# Patient Record
Sex: Female | Born: 1945 | Race: White | Hispanic: No | Marital: Married | Smoking: Former smoker
Health system: Southern US, Community
[De-identification: ages and names within clinical notes are randomized; demographics above are authoritative.]

## PROBLEM LIST (undated history)

## (undated) DIAGNOSIS — K648 Other hemorrhoids: Secondary | ICD-10-CM

## (undated) DIAGNOSIS — R159 Full incontinence of feces: Secondary | ICD-10-CM

## (undated) HISTORY — DX: Full incontinence of feces: R15.9

## (undated) HISTORY — PX: APPENDECTOMY: SHX54

## (undated) HISTORY — DX: Other hemorrhoids: K64.8

## (undated) HISTORY — PX: TONSILLECTOMY: SUR1361

## (undated) HISTORY — PX: HEMORRHOID BANDING: SHX5850

---

## 1999-05-13 ENCOUNTER — Encounter: Admission: RE | Admit: 1999-05-13 | Discharge: 1999-05-13 | Payer: Self-pay | Admitting: Gynecology

## 1999-05-13 ENCOUNTER — Encounter: Payer: Self-pay | Admitting: Gynecology

## 2000-12-05 ENCOUNTER — Other Ambulatory Visit: Admission: RE | Admit: 2000-12-05 | Discharge: 2000-12-05 | Payer: Self-pay | Admitting: Gynecology

## 2001-10-01 ENCOUNTER — Encounter: Admission: RE | Admit: 2001-10-01 | Discharge: 2001-10-01 | Payer: Self-pay | Admitting: Gynecology

## 2001-10-01 ENCOUNTER — Encounter: Payer: Self-pay | Admitting: Gynecology

## 2002-01-21 ENCOUNTER — Other Ambulatory Visit: Admission: RE | Admit: 2002-01-21 | Discharge: 2002-01-21 | Payer: Self-pay | Admitting: Gynecology

## 2003-04-21 ENCOUNTER — Other Ambulatory Visit: Admission: RE | Admit: 2003-04-21 | Discharge: 2003-04-21 | Payer: Self-pay | Admitting: Gynecology

## 2003-10-13 ENCOUNTER — Encounter: Admission: RE | Admit: 2003-10-13 | Discharge: 2003-10-13 | Payer: Self-pay | Admitting: Gynecology

## 2004-11-02 ENCOUNTER — Other Ambulatory Visit: Admission: RE | Admit: 2004-11-02 | Discharge: 2004-11-02 | Payer: Self-pay | Admitting: Gynecology

## 2005-10-20 ENCOUNTER — Encounter: Admission: RE | Admit: 2005-10-20 | Discharge: 2005-10-20 | Payer: Self-pay | Admitting: Gynecology

## 2006-09-19 ENCOUNTER — Other Ambulatory Visit: Admission: RE | Admit: 2006-09-19 | Discharge: 2006-09-19 | Payer: Self-pay | Admitting: Gynecology

## 2006-11-28 ENCOUNTER — Encounter: Admission: RE | Admit: 2006-11-28 | Discharge: 2006-11-28 | Payer: Self-pay | Admitting: Gynecology

## 2008-02-11 ENCOUNTER — Encounter: Admission: RE | Admit: 2008-02-11 | Discharge: 2008-02-11 | Payer: Self-pay | Admitting: Gynecology

## 2009-03-19 ENCOUNTER — Encounter: Admission: RE | Admit: 2009-03-19 | Discharge: 2009-03-19 | Payer: Self-pay | Admitting: Gynecology

## 2010-03-24 ENCOUNTER — Encounter: Admission: RE | Admit: 2010-03-24 | Discharge: 2010-03-24 | Payer: Self-pay | Admitting: Gynecology

## 2011-09-05 ENCOUNTER — Other Ambulatory Visit: Payer: Self-pay | Admitting: Gynecology

## 2011-09-05 DIAGNOSIS — Z1231 Encounter for screening mammogram for malignant neoplasm of breast: Secondary | ICD-10-CM

## 2011-09-22 ENCOUNTER — Ambulatory Visit
Admission: RE | Admit: 2011-09-22 | Discharge: 2011-09-22 | Disposition: A | Discharge status: Home / Self Care | Payer: BC Managed Care – PPO | Source: Ambulatory Visit | Attending: Gynecology | Admitting: Gynecology

## 2011-09-22 DIAGNOSIS — Z1231 Encounter for screening mammogram for malignant neoplasm of breast: Secondary | ICD-10-CM

## 2012-05-02 DIAGNOSIS — N8184 Pelvic muscle wasting: Secondary | ICD-10-CM | POA: Diagnosis not present

## 2012-05-02 DIAGNOSIS — Z01419 Encounter for gynecological examination (general) (routine) without abnormal findings: Secondary | ICD-10-CM | POA: Diagnosis not present

## 2012-05-02 DIAGNOSIS — Z23 Encounter for immunization: Secondary | ICD-10-CM | POA: Diagnosis not present

## 2012-05-02 DIAGNOSIS — E7800 Pure hypercholesterolemia, unspecified: Secondary | ICD-10-CM | POA: Diagnosis not present

## 2012-05-02 DIAGNOSIS — R7989 Other specified abnormal findings of blood chemistry: Secondary | ICD-10-CM | POA: Diagnosis not present

## 2012-06-18 DIAGNOSIS — D485 Neoplasm of uncertain behavior of skin: Secondary | ICD-10-CM | POA: Diagnosis not present

## 2012-11-19 ENCOUNTER — Other Ambulatory Visit: Payer: Self-pay

## 2012-11-19 DIAGNOSIS — Z1231 Encounter for screening mammogram for malignant neoplasm of breast: Secondary | ICD-10-CM

## 2012-12-30 ENCOUNTER — Ambulatory Visit
Admission: RE | Admit: 2012-12-30 | Discharge: 2012-12-30 | Disposition: A | Discharge status: Home / Self Care | Payer: Medicare Other | Source: Ambulatory Visit

## 2012-12-30 DIAGNOSIS — Z1231 Encounter for screening mammogram for malignant neoplasm of breast: Secondary | ICD-10-CM | POA: Diagnosis not present

## 2013-05-06 DIAGNOSIS — N952 Postmenopausal atrophic vaginitis: Secondary | ICD-10-CM | POA: Diagnosis not present

## 2013-05-06 DIAGNOSIS — Z23 Encounter for immunization: Secondary | ICD-10-CM | POA: Diagnosis not present

## 2013-05-06 DIAGNOSIS — Z7189 Other specified counseling: Secondary | ICD-10-CM | POA: Diagnosis not present

## 2013-05-06 DIAGNOSIS — F329 Major depressive disorder, single episode, unspecified: Secondary | ICD-10-CM | POA: Diagnosis not present

## 2013-09-16 DIAGNOSIS — L819 Disorder of pigmentation, unspecified: Secondary | ICD-10-CM | POA: Diagnosis not present

## 2013-09-16 DIAGNOSIS — D235 Other benign neoplasm of skin of trunk: Secondary | ICD-10-CM | POA: Diagnosis not present

## 2013-09-16 DIAGNOSIS — D485 Neoplasm of uncertain behavior of skin: Secondary | ICD-10-CM | POA: Diagnosis not present

## 2014-01-20 DIAGNOSIS — Z1231 Encounter for screening mammogram for malignant neoplasm of breast: Secondary | ICD-10-CM | POA: Diagnosis not present

## 2014-01-20 DIAGNOSIS — Z01419 Encounter for gynecological examination (general) (routine) without abnormal findings: Secondary | ICD-10-CM | POA: Diagnosis not present

## 2014-01-20 DIAGNOSIS — Z79890 Hormone replacement therapy (postmenopausal): Secondary | ICD-10-CM | POA: Diagnosis not present

## 2014-01-20 DIAGNOSIS — Z780 Asymptomatic menopausal state: Secondary | ICD-10-CM | POA: Diagnosis not present

## 2014-04-05 DIAGNOSIS — Z23 Encounter for immunization: Secondary | ICD-10-CM | POA: Diagnosis not present

## 2014-04-23 DIAGNOSIS — M7541 Impingement syndrome of right shoulder: Secondary | ICD-10-CM | POA: Diagnosis not present

## 2014-04-29 DIAGNOSIS — B009 Herpesviral infection, unspecified: Secondary | ICD-10-CM | POA: Diagnosis not present

## 2014-04-29 DIAGNOSIS — D485 Neoplasm of uncertain behavior of skin: Secondary | ICD-10-CM | POA: Diagnosis not present

## 2014-06-02 DIAGNOSIS — N952 Postmenopausal atrophic vaginitis: Secondary | ICD-10-CM | POA: Diagnosis not present

## 2014-06-02 DIAGNOSIS — Z136 Encounter for screening for cardiovascular disorders: Secondary | ICD-10-CM | POA: Diagnosis not present

## 2014-06-02 DIAGNOSIS — Z23 Encounter for immunization: Secondary | ICD-10-CM | POA: Diagnosis not present

## 2014-06-02 DIAGNOSIS — J309 Allergic rhinitis, unspecified: Secondary | ICD-10-CM | POA: Diagnosis not present

## 2014-06-02 DIAGNOSIS — F329 Major depressive disorder, single episode, unspecified: Secondary | ICD-10-CM | POA: Diagnosis not present

## 2014-06-02 DIAGNOSIS — Z131 Encounter for screening for diabetes mellitus: Secondary | ICD-10-CM | POA: Diagnosis not present

## 2014-07-29 DIAGNOSIS — L820 Inflamed seborrheic keratosis: Secondary | ICD-10-CM | POA: Diagnosis not present

## 2014-08-12 ENCOUNTER — Encounter: Payer: Self-pay | Admitting: Gastroenterology

## 2014-08-12 ENCOUNTER — Ambulatory Visit (INDEPENDENT_AMBULATORY_CARE_PROVIDER_SITE_OTHER): Payer: Medicare Other | Admitting: Gastroenterology

## 2014-08-12 VITALS — BP 94/58 | HR 72 | Ht 63.0 in | Wt 133.6 lb

## 2014-08-12 DIAGNOSIS — R159 Full incontinence of feces: Secondary | ICD-10-CM | POA: Diagnosis not present

## 2014-08-12 MED ORDER — NA SULFATE-K SULFATE-MG SULF 17.5-3.13-1.6 GM/177ML PO SOLN
1.0000 | Freq: Once | ORAL | Status: DC
Start: 2014-08-12 — End: 2014-10-27

## 2014-08-12 NOTE — Assessment & Plan Note (Signed)
She is experiencing some leakage of loose stools with straining during physical activity.  I suspect this is related to hemorrhoids.  There is no evidence for local rectal disease as determined by digital exam.  Recommendations #1 daily fiber supplementation #2 colonoscopy #3 to consider band ligation if it is determined that symptoms are due to hemorrhoids

## 2014-08-12 NOTE — Patient Instructions (Signed)

## 2014-08-12 NOTE — Progress Notes (Signed)
    _                                                                                                                History of Present Illness:  Alexandra Wilkerson is a pleasant 69 year old white female referred at the request of Dr. 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  For the last 8 months she has noted staining of her underclothes with stool when she engages in physical activity including weight lifting.  She is aware of the passage of a small amount of stool.  Stools generally are loose.  There has been no change in bowel habits, abdominal or rectal pain or bleeding.  Last colonoscopy was over 10 years ago.   History reviewed. No pertinent past medical history. History reviewed. No pertinent past surgical history. family history is not on file. Current Outpatient Prescriptions  Medication Sig Dispense Refill  . conjugated estrogens (PREMARIN) vaginal cream Place 1 Applicatorful vaginally daily.    Marland Kitchen escitalopram (LEXAPRO) 10 MG tablet Take 10 mg by mouth daily.  3  . naproxen sodium (ANAPROX) 220 MG tablet Take 220 mg by mouth 2 (two) times daily with a meal.    . valACYclovir (VALTREX) 500 MG tablet Take 500 mg by mouth as needed.     No current facility-administered medications for this visit.   Allergies as of 08/12/2014  . (No Known Allergies)    reports that she has quit smoking. She has never used smokeless tobacco. She reports that she drinks alcohol. She reports that she does not use illicit drugs.   Review of Systems: Pertinent positive and negative review of systems were noted in the above HPI section. All other review of systems were otherwise negative.  Vital signs were reviewed in today's medical record Physical Exam: General: Well developed , well nourished, no acute distress Skin: anicteric Head: Normocephalic and atraumatic Eyes:  sclerae anicteric, EOMI Ears: Normal auditory acuity Mouth: No deformity or lesions Neck: Supple, no masses or  thyromegaly Lungs: Clear throughout to auscultation Heart: Regular rate and rhythm; no murmurs, rubs or bruits Abdomen: Soft, non tender and non distended. No masses, hepatosplenomegaly or hernias noted. Normal Bowel sounds Rectal: There are no rectal masses.  Tone is normal.  Heme negative Musculoskeletal: Symmetrical with no gross deformities  Skin: No lesions on visible extremities Pulses:  Normal pulses noted Extremities: No clubbing, cyanosis, edema or deformities noted Neurological: Alert oriented x 4, grossly nonfocal Cervical Nodes:  No significant cervical adenopathy Inguinal Nodes: No significant inguinal adenopathy Psychological:  Alert and cooperative. Normal mood and affect  See Assessment and Plan under Problem List

## 2014-09-17 ENCOUNTER — Encounter: Payer: Medicare Other | Admitting: Gastroenterology

## 2014-10-27 ENCOUNTER — Ambulatory Visit (AMBULATORY_SURGERY_CENTER): Payer: Medicare Other | Admitting: Gastroenterology

## 2014-10-27 ENCOUNTER — Encounter: Payer: Self-pay | Admitting: Gastroenterology

## 2014-10-27 VITALS — BP 145/77 | HR 54 | Temp 97.8°F | Resp 15 | Ht 63.0 in | Wt 133.0 lb

## 2014-10-27 DIAGNOSIS — F341 Dysthymic disorder: Secondary | ICD-10-CM | POA: Diagnosis not present

## 2014-10-27 DIAGNOSIS — R159 Full incontinence of feces: Secondary | ICD-10-CM | POA: Diagnosis not present

## 2014-10-27 DIAGNOSIS — K5730 Diverticulosis of large intestine without perforation or abscess without bleeding: Secondary | ICD-10-CM

## 2014-10-27 MED ORDER — SODIUM CHLORIDE 0.9 % IV SOLN: 500.0000 mL | INTRAVENOUS | Status: DC

## 2014-10-27 NOTE — Progress Notes (Signed)
Report to PACU, RN, vss, BBS= Clear.  

## 2014-10-27 NOTE — Patient Instructions (Addendum)
Hemorrhoids and diverticulosis seen today. Repeat colonoscopy in 10 years.  Handouts given:hemorrhoids, hemorrhoidal banding, and diverticulosis.   YOU HAD AN ENDOSCOPIC PROCEDURE TODAY AT [facility]:   Refer to the procedure report that was given to you for any specific questions about what was found during the examination.  If the procedure report does not answer your questions, please call your gastroenterologist to clarify.  If you requested that your care partner not be given the details of your procedure findings, then the procedure report has been included in a sealed envelope for you to review at your convenience later.  YOU SHOULD EXPECT: Some feelings of bloating in the abdomen. Passage of more gas than usual.  Walking can help get rid of the air that was put into your GI tract during the procedure and reduce the bloating. If you had a lower endoscopy (such as a colonoscopy or flexible sigmoidoscopy) you may notice spotting of blood in your stool or on the toilet paper. If you underwent a bowel prep for your procedure, you may not have a normal bowel movement for a few days.  Please Note:  You might notice some irritation and congestion in your nose or some drainage.  This is from the oxygen used during your procedure.  There is no need for concern and it should clear up in a day or so.  SYMPTOMS TO REPORT IMMEDIATELY:   Following lower endoscopy (colonoscopy or flexible sigmoidoscopy):  Excessive amounts of blood in the stool  Significant tenderness or worsening of abdominal pains  Swelling of the abdomen that is new, acute  Fever of 100F or higher   For urgent or emergent issues, a gastroenterologist can be reached at any hour by calling [phone].   DIET: Your first meal following the procedure should be a small meal and then it is ok to progress to your normal diet. Heavy or fried foods are harder to digest and may make you feel nauseous or bloated.   Likewise, meals heavy in dairy and vegetables can increase bloating.  Drink plenty of fluids but you should avoid alcoholic beverages for 24 hours.  ACTIVITY:  You should plan to take it easy for the rest of today and you should NOT DRIVE or use heavy machinery until tomorrow (because of the sedation medicines used during the test).    FOLLOW UP: Our staff will call the number listed on your records the next business day following your procedure to check on you and address any questions or concerns that you may have regarding the information given to you following your procedure. If we do not reach you, we will leave a message.  However, if you are feeling well and you are not experiencing any problems, there is no need to return our call.  We will assume that you have returned to your regular daily activities without incident.  If any biopsies were taken you will be contacted by phone or by letter within the next 1-3 weeks.  Please call us at [phone] if you have not heard about the biopsies in 3 weeks.    SIGNATURES/CONFIDENTIALITY: You and/or your care partner have signed paperwork which will be entered into your electronic medical record.  These signatures attest to the fact that that the information above on your After Visit Summary has been reviewed and is understood.  Full responsibility of the confidentiality of this discharge information lies with you and/or your care-partner.

## 2014-10-27 NOTE — Op Note (Signed)
[place]  [address] [place], [id number]   COLONOSCOPY PROCEDURE REPORT  PATIENT: Alexandra Wilkerson, Alexandra Wilkerson  MR#: [id number] BIRTHDATE: 01-10-1946 , 37  yrs. old GENDER: female ENDOSCOPIST: Inda Castle, MD REFERRED [id number] Sabra Heck, M.D. PROCEDURE DATE:  10/27/2014 PROCEDURE:   Colonoscopy, diagnostic First Screening Colonoscopy - Avg.  risk and is 50 yrs.  old or older - No.  Prior Negative Screening - Now for repeat screening. 10 or more years since last screening  History of Adenoma - Now for follow-up colonoscopy & has been > or = to 3 yrs.  N/A  Polyps removed today? No Recommend repeat exam, <10 yrs? No ASA CLASS:   Class II INDICATIONS:Colorectal Neoplasm Risk Assessment for this procedure is average risk.   encopresis MEDICATIONS: Monitored anesthesia care and Propofol 240 mg IV  DESCRIPTION OF PROCEDURE:   After the risks benefits and alternatives of the procedure were thoroughly explained, informed consent was obtained.  The digital rectal exam revealed no abnormalities of the rectum.   The LB [id number]  endoscope was introduced through the anus and advanced to the cecum, which was identified by both the appendix and ileocecal valve. No adverse events experienced.   The quality of the prep was (Suprep was used) excellent.  The instrument was then slowly withdrawn as the colon was fully examined. Estimated blood loss is zero unless otherwise noted in this procedure report.      COLON FINDINGS: Internal hemorrhoids were found.   There was mild diverticulosis noted in the descending colon and transverse colon. The examination was otherwise normal.  Retroflexed views revealed no abnormalities. The time to cecum = 6.5 Withdrawal time = 8.4 The scope was withdrawn and the procedure completed. COMPLICATIONS: There were no immediate complications.  ENDOSCOPIC IMPRESSION: 1.   Internal hemorrhoids 2.   Mild diverticulosis was noted in the descending  colon and transverse colon 3.   The examination was otherwise normal  RECOMMENDATIONS: Repeat Colonscopy in 10 years. band ligation of internal hemorrhoids  eSigned:  Inda Castle, MD 10/27/2014 3:20 PM   cc:   PATIENT NAME:  Alexandra Wilkerson, Alexandra Wilkerson MR#: [id number]

## 2014-10-28 ENCOUNTER — Telehealth: Payer: Self-pay | Admitting: *Deleted

## 2014-10-28 NOTE — Telephone Encounter (Signed)
  Follow up Call-  Call back number 10/27/2014  Post procedure Call Back phone  # [phone]  Permission to leave phone message Yes     Patient questions:  Do you have a fever, pain , or abdominal swelling? No. Pain Score  0 *  Have you tolerated food without any problems? Yes.    Have you been able to return to your normal activities? Yes.    Do you have any questions about your discharge instructions: Diet   No. Medications  No. Follow up visit  No.  Do you have questions or concerns about your Care? No.  Actions: * If pain score is 4 or above: No action needed, pain <4.

## 2014-12-07 ENCOUNTER — Encounter: Payer: Medicare Other | Admitting: Gastroenterology

## 2014-12-29 DIAGNOSIS — I87393 Chronic venous hypertension (idiopathic) with other complications of bilateral lower extremity: Secondary | ICD-10-CM | POA: Diagnosis not present

## 2014-12-29 DIAGNOSIS — M79604 Pain in right leg: Secondary | ICD-10-CM | POA: Diagnosis not present

## 2014-12-29 DIAGNOSIS — M79605 Pain in left leg: Secondary | ICD-10-CM | POA: Diagnosis not present

## 2014-12-31 DIAGNOSIS — M79604 Pain in right leg: Secondary | ICD-10-CM | POA: Diagnosis not present

## 2014-12-31 DIAGNOSIS — M79605 Pain in left leg: Secondary | ICD-10-CM | POA: Diagnosis not present

## 2015-01-05 DIAGNOSIS — I83813 Varicose veins of bilateral lower extremities with pain: Secondary | ICD-10-CM | POA: Diagnosis not present

## 2015-02-01 DIAGNOSIS — F329 Major depressive disorder, single episode, unspecified: Secondary | ICD-10-CM | POA: Diagnosis not present

## 2015-02-01 DIAGNOSIS — Z0000 Encounter for general adult medical examination without abnormal findings: Secondary | ICD-10-CM | POA: Diagnosis not present

## 2015-02-01 DIAGNOSIS — J309 Allergic rhinitis, unspecified: Secondary | ICD-10-CM | POA: Diagnosis not present

## 2015-02-01 DIAGNOSIS — N952 Postmenopausal atrophic vaginitis: Secondary | ICD-10-CM | POA: Diagnosis not present

## 2015-02-01 DIAGNOSIS — Z780 Asymptomatic menopausal state: Secondary | ICD-10-CM | POA: Diagnosis not present

## 2015-02-01 DIAGNOSIS — Z23 Encounter for immunization: Secondary | ICD-10-CM | POA: Diagnosis not present

## 2015-02-02 ENCOUNTER — Encounter: Payer: Self-pay | Admitting: Gastroenterology

## 2015-02-02 ENCOUNTER — Ambulatory Visit (INDEPENDENT_AMBULATORY_CARE_PROVIDER_SITE_OTHER): Payer: Medicare Other | Admitting: Gastroenterology

## 2015-02-02 VITALS — BP 118/80 | HR 76 | Ht 63.0 in | Wt 135.0 lb

## 2015-02-02 DIAGNOSIS — K648 Other hemorrhoids: Secondary | ICD-10-CM | POA: Diagnosis not present

## 2015-02-02 NOTE — Progress Notes (Signed)
PROCEDURE NOTE: The patient presents with symptomatic grade *2**  hemorrhoids, requesting rubber band ligation of his/her hemorrhoidal disease.  All risks, benefits and alternative forms of therapy were described and informed consent was obtained.   The anorectum was pre-medicated with lubricant and nitroglycerine ointment The decision was made to band the *right posterior** internal hemorrhoid, and the [facility] System was used to perform band ligation without complication.  Digital anorectal examination was then performed to assure proper positioning of the band, and to adjust the banded tissue as required.  The patient was discharged home without pain or other issues.  Dietary and behavioral recommendations were given and along with follow-up instructions.    The patient will return in **4* weeks for  follow-up and possible additional banding as required. No complications were encountered and the patient tolerated the procedure well.   

## 2015-02-02 NOTE — Patient Instructions (Signed)
HEMORRHOID BANDING PROCEDURE    FOLLOW-UP CARE   1. The procedure you have had should have been relatively painless since the banding of the area involved does not have nerve endings and there is no pain sensation.  The rubber band cuts off the blood supply to the hemorrhoid and the band may fall off as soon as 48 hours after the banding (the band may occasionally be seen in the toilet bowl following a bowel movement). You may notice a temporary feeling of fullness in the rectum which should respond adequately to plain Tylenol or Motrin.  2. Following the banding, avoid strenuous exercise that evening and resume full activity the next day.  A sitz bath (soaking in a warm tub) or bidet is soothing, and can be useful for cleansing the area after bowel movements.     3. To avoid constipation, take two tablespoons of natural wheat bran, natural oat bran, flax, Benefiber or any over the counter fiber supplement and increase your water intake to 7-8 glasses daily.    4. Unless you have been prescribed anorectal medication, do not put anything inside your rectum for two weeks: No suppositories, enemas, fingers, etc.  5. Occasionally, you may have more bleeding than usual after the banding procedure.  This is often from the untreated hemorrhoids rather than the treated one.  Don't be concerned if there is a tablespoon or so of blood.  If there is more blood than this, lie flat with your bottom higher than your head and apply an ice pack to the area. If the bleeding does not stop within a half an hour or if you feel faint, call our office at [phone] or go to the emergency room.  6. Problems are not common; however, if there is a substantial amount of bleeding, severe pain, chills, fever or difficulty passing urine (very rare) or other problems, you should call us at [phone] or report to the nearest emergency room.  7. Do not stay seated continuously for more than 2-3 hours for a day or two  after the procedure.  Tighten your buttock muscles 10-15 times every two hours and take 10-15 deep breaths every 1-2 hours.  Do not spend more than a few minutes on the toilet if you cannot empty your bowel; instead re-visit the toilet at a later time.   Your 2nd banding is scheduled on 04/05/2015 at 10am

## 2015-02-15 DIAGNOSIS — M8589 Other specified disorders of bone density and structure, multiple sites: Secondary | ICD-10-CM | POA: Diagnosis not present

## 2015-02-15 DIAGNOSIS — E2839 Other primary ovarian failure: Secondary | ICD-10-CM | POA: Diagnosis not present

## 2015-02-23 ENCOUNTER — Telehealth: Payer: Self-pay | Admitting: Internal Medicine

## 2015-02-23 NOTE — Telephone Encounter (Signed)
Spoke w/pt and sch'd appt for 04-13-15 w/Dr. Carlean Purl

## 2015-02-23 NOTE — Telephone Encounter (Signed)
yes

## 2015-04-05 ENCOUNTER — Encounter: Payer: Medicare Other | Admitting: Gastroenterology

## 2015-04-13 ENCOUNTER — Encounter: Payer: Self-pay | Admitting: Internal Medicine

## 2015-04-13 ENCOUNTER — Ambulatory Visit (INDEPENDENT_AMBULATORY_CARE_PROVIDER_SITE_OTHER): Payer: Medicare Other | Admitting: Internal Medicine

## 2015-04-13 VITALS — BP 114/76 | HR 72 | Ht 62.25 in | Wt 138.1 lb

## 2015-04-13 DIAGNOSIS — K648 Other hemorrhoids: Secondary | ICD-10-CM | POA: Diagnosis not present

## 2015-04-13 NOTE — Assessment & Plan Note (Signed)
LL banded

## 2015-04-13 NOTE — Progress Notes (Signed)
   Sxs are fecal soiling - improved since banding of RP  Anoscopy was performed with the patient in the left lateral decubitus position while a chaperone was present and revealed gr LL and RA internal hemorrhoids2   PROCEDURE NOTE: The patient presents with symptomatic grade 2  hemorrhoids, requesting rubber band ligation of his/her hemorrhoidal disease.  All risks, benefits and alternative forms of therapy were described and informed consent was obtained.   The anorectum was pre-medicated with NTG/lidocaine The decision was made to band the LL internal hemorrhoid, and the [place] was used to perform band ligation without complication.  Digital anorectal examination was then performed to assure proper positioning of the band, and to adjust the banded tissue as required.  The patient was discharged home without pain or other issues.  Dietary and behavioral recommendations were given and along with follow-up instructions.       The patient will return in Jan 2017 for  follow-up and possible additional banding as required. No complications were encountered and the patient tolerated the procedure well.  I appreciate the opportunity to care for this patient. [id number] Jeani Hawking, MD

## 2015-04-13 NOTE — Patient Instructions (Addendum)

## 2015-05-25 ENCOUNTER — Ambulatory Visit (INDEPENDENT_AMBULATORY_CARE_PROVIDER_SITE_OTHER): Payer: Medicare Other | Admitting: Internal Medicine

## 2015-05-25 ENCOUNTER — Encounter: Payer: Self-pay | Admitting: Internal Medicine

## 2015-05-25 VITALS — BP 136/70 | HR 68 | Ht 62.25 in | Wt 137.0 lb

## 2015-05-25 DIAGNOSIS — K648 Other hemorrhoids: Secondary | ICD-10-CM

## 2015-05-25 NOTE — Progress Notes (Signed)
   PROCEDURE NOTE: The patient presents with symptomatic grade 2  hemorrhoids, requesting rubber band ligation of his/her hemorrhoidal disease.  All risks, benefits and alternative forms of therapy were described and informed consent was obtained.   The anorectum was pre-medicated with 0.125% NTG and 5% lidocaine The decision was made to band the RA internal hemorrhoid, and the [place] was used to perform band ligation without complication.  Digital anorectal examination was then performed to assure proper positioning of the band, and to adjust the banded tissue as required.  The patient was discharged home without pain or other issues.  Dietary and behavioral recommendations were given and along with follow-up instructions.       The patient will return prn  for  follow-up and possible additional banding as required. No complications were encountered and the patient tolerated the procedure well.

## 2015-05-25 NOTE — Assessment & Plan Note (Addendum)
RA banded RTC prn after 2 months benefiber optional

## 2015-05-25 NOTE — Patient Instructions (Addendum)
Follow up with Dr. Carlean Purl as needed.  Call back in 2 months if you are still having problems.  HEMORRHOID BANDING PROCEDURE    FOLLOW-UP CARE   1. The procedure you have had should have been relatively painless since the banding of the area involved does not have nerve endings and there is no pain sensation.  The rubber band cuts off the blood supply to the hemorrhoid and the band may fall off as soon as 48 hours after the banding (the band may occasionally be seen in the toilet bowl following a bowel movement). You may notice a temporary feeling of fullness in the rectum which should respond adequately to plain Tylenol or Motrin.  2. Following the banding, avoid strenuous exercise that evening and resume full activity the next day.  A sitz bath (soaking in a warm tub) or bidet is soothing, and can be useful for cleansing the area after bowel movements.     3. To avoid constipation, take two tablespoons of natural wheat bran, natural oat bran, flax, Benefiber or any over the counter fiber supplement and increase your water intake to 7-8 glasses daily.    4. Unless you have been prescribed anorectal medication, do not put anything inside your rectum for two weeks: No suppositories, enemas, fingers, etc.  5. Occasionally, you may have more bleeding than usual after the banding procedure.  This is often from the untreated hemorrhoids rather than the treated one.  Don't be concerned if there is a tablespoon or so of blood.  If there is more blood than this, lie flat with your bottom higher than your head and apply an ice pack to the area. If the bleeding does not stop within a half an hour or if you feel faint, call our office at [phone] or go to the emergency room.  6. Problems are not common; however, if there is a substantial amount of bleeding, severe pain, chills, fever or difficulty passing urine (very rare) or other problems, you should call us at [phone] or report to the  nearest emergency room.  7. Do not stay seated continuously for more than 2-3 hours for a day or two after the procedure.  Tighten your buttock muscles 10-15 times every two hours and take 10-15 deep breaths every 1-2 hours.  Do not spend more than a few minutes on the toilet if you cannot empty your bowel; instead re-visit the toilet at a later time.

## 2015-06-01 DIAGNOSIS — I83813 Varicose veins of bilateral lower extremities with pain: Secondary | ICD-10-CM | POA: Diagnosis not present

## 2015-07-21 DIAGNOSIS — I83811 Varicose veins of right lower extremities with pain: Secondary | ICD-10-CM | POA: Diagnosis not present

## 2015-07-21 DIAGNOSIS — I83891 Varicose veins of right lower extremities with other complications: Secondary | ICD-10-CM | POA: Diagnosis not present

## 2015-07-23 DIAGNOSIS — M79604 Pain in right leg: Secondary | ICD-10-CM | POA: Diagnosis not present

## 2015-08-12 DIAGNOSIS — I83891 Varicose veins of right lower extremities with other complications: Secondary | ICD-10-CM | POA: Diagnosis not present

## 2015-08-12 DIAGNOSIS — I83811 Varicose veins of right lower extremities with pain: Secondary | ICD-10-CM | POA: Diagnosis not present

## 2015-09-09 ENCOUNTER — Other Ambulatory Visit: Payer: Self-pay

## 2015-09-09 DIAGNOSIS — Z1231 Encounter for screening mammogram for malignant neoplasm of breast: Secondary | ICD-10-CM

## 2015-09-16 DIAGNOSIS — L0100 Impetigo, unspecified: Secondary | ICD-10-CM | POA: Diagnosis not present

## 2015-09-16 DIAGNOSIS — L814 Other melanin hyperpigmentation: Secondary | ICD-10-CM | POA: Diagnosis not present

## 2015-09-16 DIAGNOSIS — B009 Herpesviral infection, unspecified: Secondary | ICD-10-CM | POA: Diagnosis not present

## 2015-09-16 DIAGNOSIS — D235 Other benign neoplasm of skin of trunk: Secondary | ICD-10-CM | POA: Diagnosis not present

## 2015-09-16 DIAGNOSIS — D1801 Hemangioma of skin and subcutaneous tissue: Secondary | ICD-10-CM | POA: Diagnosis not present

## 2015-09-16 DIAGNOSIS — L570 Actinic keratosis: Secondary | ICD-10-CM | POA: Diagnosis not present

## 2015-09-16 DIAGNOSIS — L820 Inflamed seborrheic keratosis: Secondary | ICD-10-CM | POA: Diagnosis not present

## 2015-09-22 ENCOUNTER — Ambulatory Visit
Admission: RE | Admit: 2015-09-22 | Discharge: 2015-09-22 | Disposition: A | Discharge status: Home / Self Care | Payer: Medicare Other | Source: Ambulatory Visit

## 2015-09-22 DIAGNOSIS — Z1231 Encounter for screening mammogram for malignant neoplasm of breast: Secondary | ICD-10-CM

## 2015-09-27 DIAGNOSIS — M7981 Nontraumatic hematoma of soft tissue: Secondary | ICD-10-CM | POA: Diagnosis not present

## 2015-09-27 DIAGNOSIS — I83811 Varicose veins of right lower extremities with pain: Secondary | ICD-10-CM | POA: Diagnosis not present

## 2016-01-08 DIAGNOSIS — R1030 Lower abdominal pain, unspecified: Secondary | ICD-10-CM | POA: Diagnosis not present

## 2016-01-08 DIAGNOSIS — N1330 Unspecified hydronephrosis: Secondary | ICD-10-CM | POA: Diagnosis not present

## 2016-01-08 DIAGNOSIS — Z9089 Acquired absence of other organs: Secondary | ICD-10-CM | POA: Diagnosis not present

## 2016-01-08 DIAGNOSIS — N132 Hydronephrosis with renal and ureteral calculous obstruction: Secondary | ICD-10-CM | POA: Diagnosis not present

## 2016-01-08 DIAGNOSIS — R112 Nausea with vomiting, unspecified: Secondary | ICD-10-CM | POA: Diagnosis not present

## 2016-01-08 DIAGNOSIS — N201 Calculus of ureter: Secondary | ICD-10-CM | POA: Diagnosis not present

## 2016-01-08 DIAGNOSIS — N200 Calculus of kidney: Secondary | ICD-10-CM | POA: Diagnosis not present

## 2016-01-27 DIAGNOSIS — I83892 Varicose veins of left lower extremities with other complications: Secondary | ICD-10-CM | POA: Diagnosis not present

## 2016-01-27 DIAGNOSIS — I83812 Varicose veins of left lower extremities with pain: Secondary | ICD-10-CM | POA: Diagnosis not present

## 2016-02-07 DIAGNOSIS — N952 Postmenopausal atrophic vaginitis: Secondary | ICD-10-CM | POA: Diagnosis not present

## 2016-02-07 DIAGNOSIS — M859 Disorder of bone density and structure, unspecified: Secondary | ICD-10-CM | POA: Diagnosis not present

## 2016-02-07 DIAGNOSIS — F329 Major depressive disorder, single episode, unspecified: Secondary | ICD-10-CM | POA: Diagnosis not present

## 2016-02-07 DIAGNOSIS — Z0000 Encounter for general adult medical examination without abnormal findings: Secondary | ICD-10-CM | POA: Diagnosis not present

## 2016-02-07 DIAGNOSIS — Z23 Encounter for immunization: Secondary | ICD-10-CM | POA: Diagnosis not present

## 2016-02-07 DIAGNOSIS — J309 Allergic rhinitis, unspecified: Secondary | ICD-10-CM | POA: Diagnosis not present

## 2016-02-07 DIAGNOSIS — Z1159 Encounter for screening for other viral diseases: Secondary | ICD-10-CM | POA: Diagnosis not present

## 2016-02-07 DIAGNOSIS — M8580 Other specified disorders of bone density and structure, unspecified site: Secondary | ICD-10-CM | POA: Diagnosis not present

## 2016-03-22 DIAGNOSIS — I83812 Varicose veins of left lower extremities with pain: Secondary | ICD-10-CM | POA: Diagnosis not present

## 2016-03-22 DIAGNOSIS — I83892 Varicose veins of left lower extremities with other complications: Secondary | ICD-10-CM | POA: Diagnosis not present

## 2016-03-24 DIAGNOSIS — I83812 Varicose veins of left lower extremities with pain: Secondary | ICD-10-CM | POA: Diagnosis not present

## 2016-03-24 DIAGNOSIS — I83892 Varicose veins of left lower extremities with other complications: Secondary | ICD-10-CM | POA: Diagnosis not present

## 2016-04-12 DIAGNOSIS — L821 Other seborrheic keratosis: Secondary | ICD-10-CM | POA: Diagnosis not present

## 2016-04-12 DIAGNOSIS — L820 Inflamed seborrheic keratosis: Secondary | ICD-10-CM | POA: Diagnosis not present

## 2016-04-20 DIAGNOSIS — I8312 Varicose veins of left lower extremity with inflammation: Secondary | ICD-10-CM | POA: Diagnosis not present

## 2016-04-20 DIAGNOSIS — I83812 Varicose veins of left lower extremities with pain: Secondary | ICD-10-CM | POA: Diagnosis not present

## 2016-05-01 DIAGNOSIS — I83892 Varicose veins of left lower extremities with other complications: Secondary | ICD-10-CM | POA: Diagnosis not present

## 2016-05-01 DIAGNOSIS — I8312 Varicose veins of left lower extremity with inflammation: Secondary | ICD-10-CM | POA: Diagnosis not present

## 2016-05-30 DIAGNOSIS — L820 Inflamed seborrheic keratosis: Secondary | ICD-10-CM | POA: Diagnosis not present

## 2016-05-30 DIAGNOSIS — L821 Other seborrheic keratosis: Secondary | ICD-10-CM | POA: Diagnosis not present

## 2016-05-30 DIAGNOSIS — L432 Lichenoid drug reaction: Secondary | ICD-10-CM | POA: Diagnosis not present

## 2016-10-02 DIAGNOSIS — D225 Melanocytic nevi of trunk: Secondary | ICD-10-CM | POA: Diagnosis not present

## 2016-10-02 DIAGNOSIS — L821 Other seborrheic keratosis: Secondary | ICD-10-CM | POA: Diagnosis not present

## 2016-10-02 DIAGNOSIS — L820 Inflamed seborrheic keratosis: Secondary | ICD-10-CM | POA: Diagnosis not present

## 2016-10-02 DIAGNOSIS — L814 Other melanin hyperpigmentation: Secondary | ICD-10-CM | POA: Diagnosis not present

## 2016-10-02 DIAGNOSIS — D1801 Hemangioma of skin and subcutaneous tissue: Secondary | ICD-10-CM | POA: Diagnosis not present

## 2016-11-28 ENCOUNTER — Other Ambulatory Visit: Payer: Self-pay | Admitting: Family Medicine

## 2016-11-28 DIAGNOSIS — Z1231 Encounter for screening mammogram for malignant neoplasm of breast: Secondary | ICD-10-CM

## 2016-12-12 ENCOUNTER — Ambulatory Visit
Admission: RE | Admit: 2016-12-12 | Discharge: 2016-12-12 | Disposition: A | Discharge status: Home / Self Care | Payer: Medicare Other | Source: Ambulatory Visit | Attending: Family Medicine | Admitting: Family Medicine

## 2016-12-12 DIAGNOSIS — Z1231 Encounter for screening mammogram for malignant neoplasm of breast: Secondary | ICD-10-CM

## 2017-01-04 ENCOUNTER — Ambulatory Visit (INDEPENDENT_AMBULATORY_CARE_PROVIDER_SITE_OTHER): Payer: Medicare Other

## 2017-01-04 ENCOUNTER — Ambulatory Visit (INDEPENDENT_AMBULATORY_CARE_PROVIDER_SITE_OTHER): Payer: Medicare Other | Admitting: Podiatry

## 2017-01-04 DIAGNOSIS — M79671 Pain in right foot: Secondary | ICD-10-CM

## 2017-01-04 DIAGNOSIS — M21619 Bunion of unspecified foot: Secondary | ICD-10-CM | POA: Diagnosis not present

## 2017-01-04 DIAGNOSIS — M779 Enthesopathy, unspecified: Secondary | ICD-10-CM

## 2017-01-04 MED ORDER — MELOXICAM 15 MG PO TABS: 15.0000 mg | ORAL_TABLET | Freq: Every day | ORAL | 2 refills | Status: DC

## 2017-01-04 MED ORDER — TRIAMCINOLONE ACETONIDE 10 MG/ML IJ SUSP: 10.0000 mg | Freq: Once | INTRAMUSCULAR | Status: AC

## 2017-01-04 NOTE — Progress Notes (Signed)
Subjective:    Patient ID: Alexandra Wilkerson, female   DOB: 71 y.o.   MRN: [id number]   HPI patient presents stating she's getting a lot of pain on top of her right foot and she also has had long-term history of bunion deformity with family history that are not extremely tender but she is not happy with how they look cosmetically    Review of Systems  All other systems reviewed and are negative.       Objective:  Physical Exam  Constitutional: She appears well-developed and well-nourished.  Cardiovascular: Intact distal pulses.   Musculoskeletal: Normal range of motion.  Neurological: She is alert.  Skin: Skin is warm.  Nursing note and vitals reviewed.  neurovascular status found to be intact with muscle strength adequate and range of motion within normal limits. Patient has exquisite discomfort on the dorsal lateral aspect of the right foot with inflammation of the tendinous complex and has large structural bunion deformity bilateral which are mildly painful and red with significant deviation the hallux. I did note depression of the arch and patient is found to have good digital perfusion and is well oriented 3     Assessment:    Tendinitis most likely of the dorsal lateral foot possibility of arthritis was structural bunion deformity and moderate collapse medial longitudinal arch     Plan:    H&P condition reviewed and careful dorsal injection administered lateral 3 mg Kenalog 5 mg Xylocaine ice therapy was applied and instructions for supportive shoes and I did go ahead today and I dispensed a fascial brace to lift the arch. I then went ahead and discussed anti-inflammatories placed on meloxicam and if symptoms are not improved in 2 weeks we will have to consider complete immobilization  Trays were negative for signs dorsal tendinitis or mid foot arthritis with severe structural bunion deformity right

## 2017-03-08 DIAGNOSIS — M1711 Unilateral primary osteoarthritis, right knee: Secondary | ICD-10-CM | POA: Diagnosis not present

## 2017-03-08 DIAGNOSIS — M1712 Unilateral primary osteoarthritis, left knee: Secondary | ICD-10-CM | POA: Diagnosis not present

## 2017-03-15 DIAGNOSIS — Z23 Encounter for immunization: Secondary | ICD-10-CM | POA: Diagnosis not present

## 2017-04-03 ENCOUNTER — Telehealth: Payer: Self-pay | Admitting: *Deleted

## 2017-04-03 MED ORDER — MELOXICAM 15 MG PO TABS: 15.0000 mg | ORAL_TABLET | Freq: Every day | ORAL | 0 refills | Status: AC

## 2017-04-03 NOTE — Telephone Encounter (Signed)
Refill request for meloxicam. Dr. Paulla Dolly ordered refill once, pt needs an appt if continuing to have pain.

## 2017-04-10 DIAGNOSIS — M1712 Unilateral primary osteoarthritis, left knee: Secondary | ICD-10-CM | POA: Diagnosis not present

## 2017-05-02 ENCOUNTER — Telehealth: Payer: Self-pay | Admitting: *Deleted

## 2017-05-02 NOTE — Telephone Encounter (Signed)
Refill request for meloxicam. Pt has not been seen since 12/2016, Dr. Paulla Dolly states pt needs an appt. Return fax denying.

## 2017-05-21 DIAGNOSIS — F325 Major depressive disorder, single episode, in full remission: Secondary | ICD-10-CM | POA: Diagnosis not present

## 2017-05-21 DIAGNOSIS — N952 Postmenopausal atrophic vaginitis: Secondary | ICD-10-CM | POA: Diagnosis not present

## 2017-05-21 DIAGNOSIS — Z0000 Encounter for general adult medical examination without abnormal findings: Secondary | ICD-10-CM | POA: Diagnosis not present

## 2017-05-21 DIAGNOSIS — J309 Allergic rhinitis, unspecified: Secondary | ICD-10-CM | POA: Diagnosis not present

## 2017-05-21 DIAGNOSIS — M8580 Other specified disorders of bone density and structure, unspecified site: Secondary | ICD-10-CM | POA: Diagnosis not present

## 2017-05-29 DIAGNOSIS — M1712 Unilateral primary osteoarthritis, left knee: Secondary | ICD-10-CM | POA: Diagnosis not present

## 2017-06-07 DIAGNOSIS — M1712 Unilateral primary osteoarthritis, left knee: Secondary | ICD-10-CM | POA: Diagnosis not present

## 2017-06-18 DIAGNOSIS — M1712 Unilateral primary osteoarthritis, left knee: Secondary | ICD-10-CM | POA: Diagnosis not present

## 2017-09-15 DIAGNOSIS — D3709 Neoplasm of uncertain behavior of other specified sites of the oral cavity: Secondary | ICD-10-CM | POA: Diagnosis not present

## 2017-10-03 DIAGNOSIS — D1039 Benign neoplasm of other parts of mouth: Secondary | ICD-10-CM | POA: Diagnosis not present

## 2017-11-01 DIAGNOSIS — M1711 Unilateral primary osteoarthritis, right knee: Secondary | ICD-10-CM | POA: Diagnosis not present

## 2017-11-01 DIAGNOSIS — M25511 Pain in right shoulder: Secondary | ICD-10-CM | POA: Diagnosis not present

## 2017-11-01 DIAGNOSIS — M1712 Unilateral primary osteoarthritis, left knee: Secondary | ICD-10-CM | POA: Diagnosis not present

## 2017-11-22 ENCOUNTER — Other Ambulatory Visit: Payer: Self-pay | Admitting: Family Medicine

## 2017-11-22 DIAGNOSIS — Z1231 Encounter for screening mammogram for malignant neoplasm of breast: Secondary | ICD-10-CM

## 2017-11-28 DIAGNOSIS — L821 Other seborrheic keratosis: Secondary | ICD-10-CM | POA: Diagnosis not present

## 2017-11-28 DIAGNOSIS — D225 Melanocytic nevi of trunk: Secondary | ICD-10-CM | POA: Diagnosis not present

## 2017-11-28 DIAGNOSIS — L814 Other melanin hyperpigmentation: Secondary | ICD-10-CM | POA: Diagnosis not present

## 2017-11-30 IMAGING — MG MM DIGITAL SCREENING BILAT
4 series · 4 of 4 positions shown · non-contrast
Comparison: Previous exam(s).

CLINICAL DATA: Screening.

EXAM:
DIGITAL SCREENING BILATERAL MAMMOGRAM WITH CAD

[R CC]
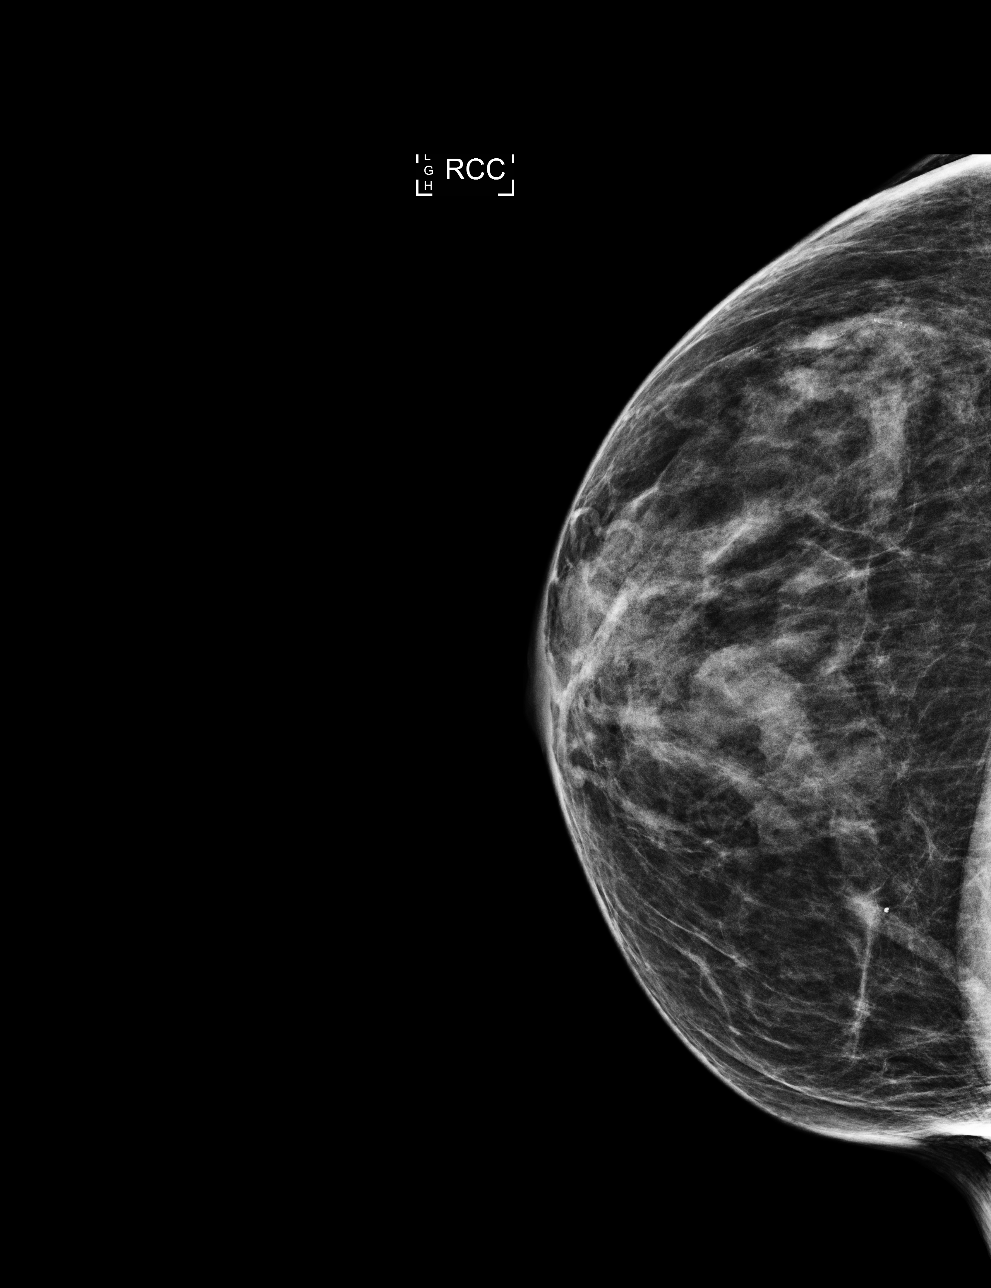

[L MLO]
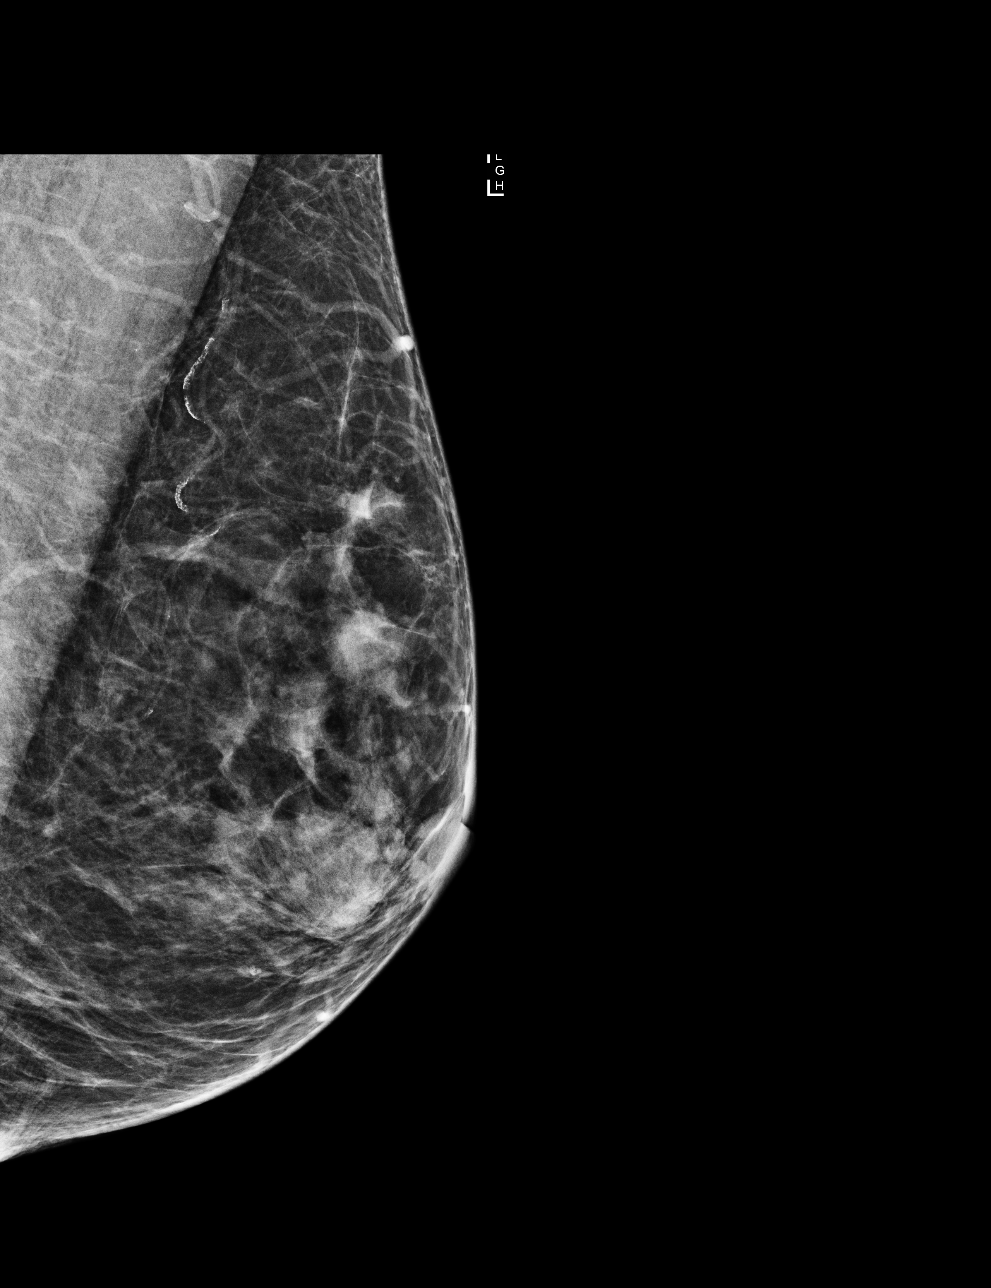

[L CC]
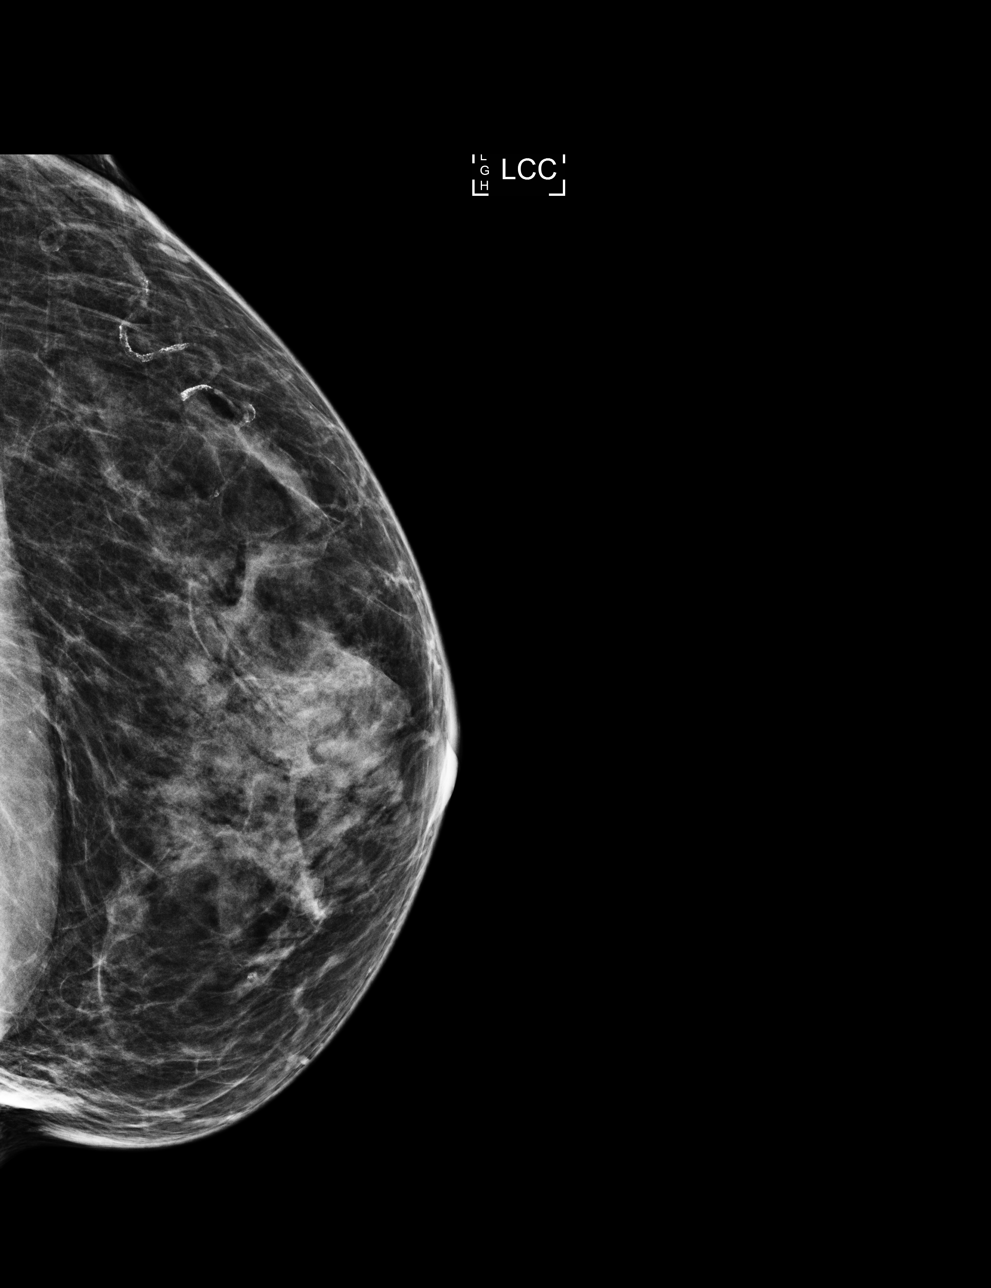

[R MLO]
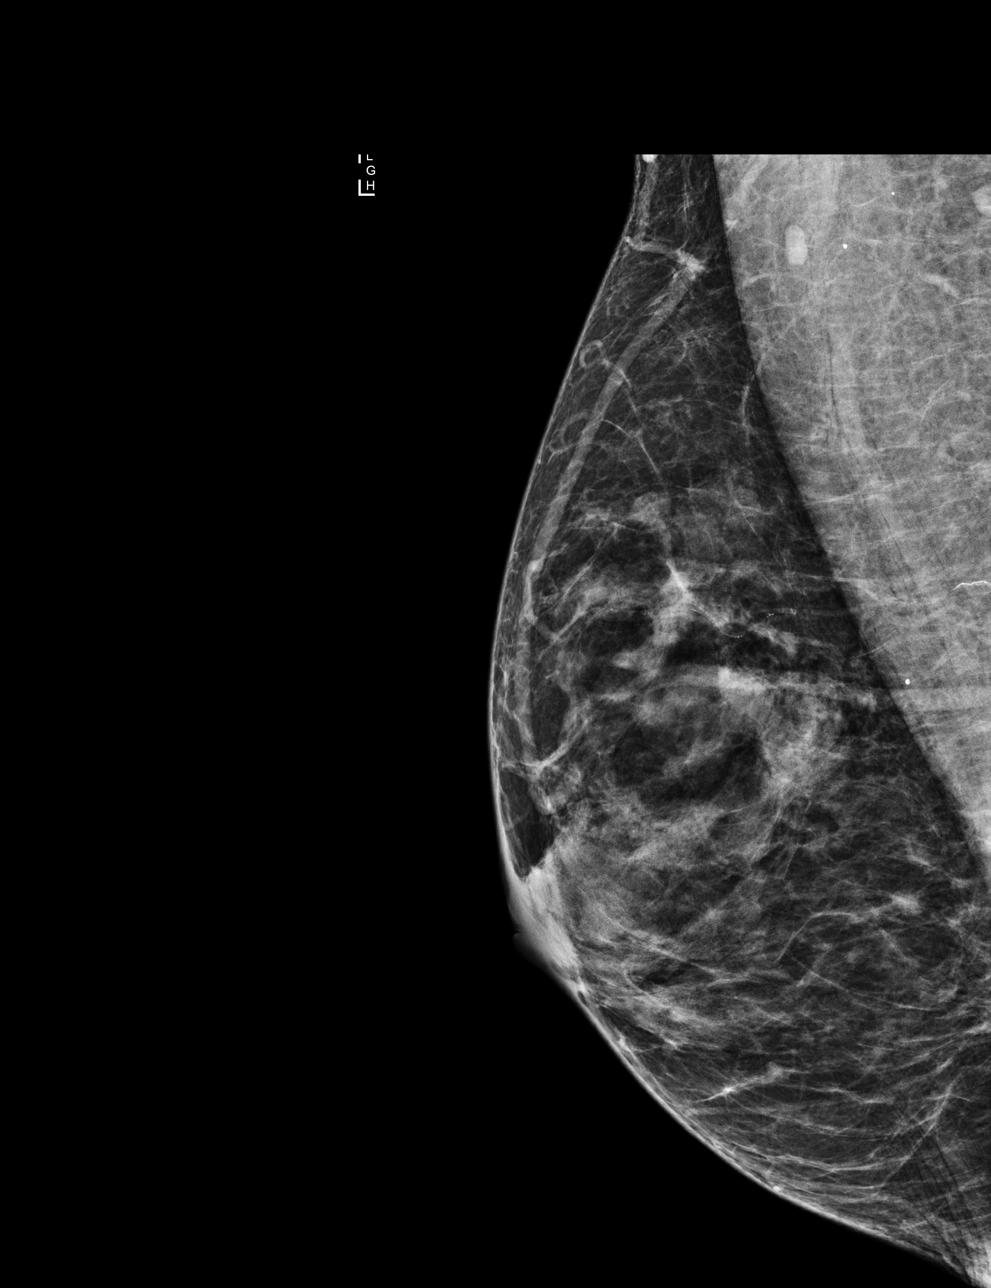

[4 of 4 positions shown; findings below may reference images not displayed]

ACR Breast Density Category c: The breast tissue is heterogeneously
dense, which may obscure small masses.
FINDINGS: There are no findings suspicious for malignancy. Images were
processed with CAD.
IMPRESSION: No mammographic evidence of malignancy. A result letter of this
screening mammogram will be mailed directly to the patient.

RECOMMENDATION:
Screening mammogram in one year. (Code:[id number])

BI-RADS CATEGORY  1: Negative.

## 2017-12-18 ENCOUNTER — Ambulatory Visit
Admission: RE | Admit: 2017-12-18 | Discharge: 2017-12-18 | Disposition: A | Discharge status: Home / Self Care | Payer: Medicare Other | Source: Ambulatory Visit | Attending: Family Medicine | Admitting: Family Medicine

## 2017-12-18 DIAGNOSIS — Z1231 Encounter for screening mammogram for malignant neoplasm of breast: Secondary | ICD-10-CM | POA: Diagnosis not present

## 2017-12-20 DIAGNOSIS — I87393 Chronic venous hypertension (idiopathic) with other complications of bilateral lower extremity: Secondary | ICD-10-CM | POA: Diagnosis not present

## 2018-01-17 DIAGNOSIS — M1712 Unilateral primary osteoarthritis, left knee: Secondary | ICD-10-CM | POA: Diagnosis not present

## 2018-01-24 DIAGNOSIS — M1712 Unilateral primary osteoarthritis, left knee: Secondary | ICD-10-CM | POA: Diagnosis not present

## 2018-01-31 DIAGNOSIS — M1712 Unilateral primary osteoarthritis, left knee: Secondary | ICD-10-CM | POA: Diagnosis not present

## 2018-04-08 DIAGNOSIS — Z23 Encounter for immunization: Secondary | ICD-10-CM | POA: Diagnosis not present

## 2018-06-11 DIAGNOSIS — F325 Major depressive disorder, single episode, in full remission: Secondary | ICD-10-CM | POA: Diagnosis not present

## 2018-06-11 DIAGNOSIS — R030 Elevated blood-pressure reading, without diagnosis of hypertension: Secondary | ICD-10-CM | POA: Diagnosis not present

## 2018-06-11 DIAGNOSIS — N952 Postmenopausal atrophic vaginitis: Secondary | ICD-10-CM | POA: Diagnosis not present

## 2018-06-11 DIAGNOSIS — M8580 Other specified disorders of bone density and structure, unspecified site: Secondary | ICD-10-CM | POA: Diagnosis not present

## 2018-06-11 DIAGNOSIS — R5383 Other fatigue: Secondary | ICD-10-CM | POA: Diagnosis not present

## 2018-06-11 DIAGNOSIS — J309 Allergic rhinitis, unspecified: Secondary | ICD-10-CM | POA: Diagnosis not present

## 2018-06-11 DIAGNOSIS — Z0000 Encounter for general adult medical examination without abnormal findings: Secondary | ICD-10-CM | POA: Diagnosis not present

## 2018-06-12 DIAGNOSIS — R5383 Other fatigue: Secondary | ICD-10-CM | POA: Diagnosis not present

## 2018-06-12 DIAGNOSIS — Z0000 Encounter for general adult medical examination without abnormal findings: Secondary | ICD-10-CM | POA: Diagnosis not present

## 2018-06-12 DIAGNOSIS — N952 Postmenopausal atrophic vaginitis: Secondary | ICD-10-CM | POA: Diagnosis not present

## 2018-06-12 DIAGNOSIS — F325 Major depressive disorder, single episode, in full remission: Secondary | ICD-10-CM | POA: Diagnosis not present

## 2018-06-12 DIAGNOSIS — J309 Allergic rhinitis, unspecified: Secondary | ICD-10-CM | POA: Diagnosis not present

## 2018-06-12 DIAGNOSIS — M859 Disorder of bone density and structure, unspecified: Secondary | ICD-10-CM | POA: Diagnosis not present

## 2018-06-12 DIAGNOSIS — R030 Elevated blood-pressure reading, without diagnosis of hypertension: Secondary | ICD-10-CM | POA: Diagnosis not present

## 2018-07-16 DIAGNOSIS — Z6825 Body mass index (BMI) 25.0-25.9, adult: Secondary | ICD-10-CM | POA: Diagnosis not present

## 2018-07-16 DIAGNOSIS — I10 Essential (primary) hypertension: Secondary | ICD-10-CM | POA: Diagnosis not present

## 2018-09-18 DIAGNOSIS — I10 Essential (primary) hypertension: Secondary | ICD-10-CM | POA: Diagnosis not present

## 2018-12-03 DIAGNOSIS — L814 Other melanin hyperpigmentation: Secondary | ICD-10-CM | POA: Diagnosis not present

## 2018-12-03 DIAGNOSIS — L819 Disorder of pigmentation, unspecified: Secondary | ICD-10-CM | POA: Diagnosis not present

## 2018-12-03 DIAGNOSIS — D229 Melanocytic nevi, unspecified: Secondary | ICD-10-CM | POA: Diagnosis not present

## 2018-12-03 DIAGNOSIS — L718 Other rosacea: Secondary | ICD-10-CM | POA: Diagnosis not present

## 2018-12-03 DIAGNOSIS — L821 Other seborrheic keratosis: Secondary | ICD-10-CM | POA: Diagnosis not present

## 2018-12-03 DIAGNOSIS — D485 Neoplasm of uncertain behavior of skin: Secondary | ICD-10-CM | POA: Diagnosis not present

## 2018-12-03 DIAGNOSIS — D1801 Hemangioma of skin and subcutaneous tissue: Secondary | ICD-10-CM | POA: Diagnosis not present

## 2018-12-04 DIAGNOSIS — L820 Inflamed seborrheic keratosis: Secondary | ICD-10-CM | POA: Diagnosis not present

## 2019-02-05 ENCOUNTER — Other Ambulatory Visit: Payer: Self-pay | Admitting: Family Medicine

## 2019-02-05 DIAGNOSIS — Z1231 Encounter for screening mammogram for malignant neoplasm of breast: Secondary | ICD-10-CM

## 2019-02-05 DIAGNOSIS — L820 Inflamed seborrheic keratosis: Secondary | ICD-10-CM | POA: Diagnosis not present

## 2019-02-06 ENCOUNTER — Other Ambulatory Visit: Payer: Self-pay

## 2019-02-06 ENCOUNTER — Ambulatory Visit
Admission: RE | Admit: 2019-02-06 | Discharge: 2019-02-06 | Disposition: A | Discharge status: Home / Self Care | Payer: Medicare Other | Source: Ambulatory Visit | Attending: Family Medicine | Admitting: Family Medicine

## 2019-02-06 DIAGNOSIS — Z1231 Encounter for screening mammogram for malignant neoplasm of breast: Secondary | ICD-10-CM | POA: Diagnosis not present

## 2019-02-22 DIAGNOSIS — Z23 Encounter for immunization: Secondary | ICD-10-CM | POA: Diagnosis not present

## 2019-06-03 ENCOUNTER — Ambulatory Visit: Payer: Medicare Other | Attending: Internal Medicine

## 2019-06-03 DIAGNOSIS — Z23 Encounter for immunization: Secondary | ICD-10-CM | POA: Diagnosis not present

## 2019-06-03 NOTE — Progress Notes (Signed)
   Covid-19 Vaccination Clinic  Name:  Alexandra Wilkerson    MRN: [id number] DOB: 03/03/1946  06/03/2019  Ms. Alexandra Wilkerson was observed post Covid-19 immunization for 15 minutes without incidence. She was provided with Vaccine Information Sheet and instruction to access the V-Safe system.   Ms. Alexandra Wilkerson was instructed to call 911 with any severe reactions post vaccine: Marland Kitchen Difficulty breathing  . Swelling of your face and throat  . A fast heartbeat  . A bad rash all over your body  . Dizziness and weakness    Immunizations Administered    Name Date Dose VIS Date Route   Pfizer COVID-19 Vaccine 06/03/2019  5:58 PM 0.3 mL 04/25/2019 Intramuscular   Manufacturer: [place]   Lot: [id number]   [place]: [id number]

## 2019-06-21 ENCOUNTER — Ambulatory Visit: Payer: Medicare Other | Attending: Internal Medicine

## 2019-06-21 DIAGNOSIS — Z23 Encounter for immunization: Secondary | ICD-10-CM

## 2019-06-21 NOTE — Progress Notes (Signed)
   Covid-19 Vaccination Clinic  Name:  Maribi Malley    MRN: [id number] DOB: 11-Jan-1946  06/21/2019  Ms. Pier was observed post Covid-19 immunization for 15 minutes without incidence. She was provided with Vaccine Information Sheet and instruction to access the V-Safe system.   Ms. Petronella was instructed to call 911 with any severe reactions post vaccine: Marland Kitchen Difficulty breathing  . Swelling of your face and throat  . A fast heartbeat  . A bad rash all over your body  . Dizziness and weakness    Immunizations Administered    Name Date Dose VIS Date Route   Pfizer COVID-19 Vaccine 06/21/2019 12:44 PM 0.3 mL 04/25/2019 Intramuscular   Manufacturer: [place]   Lot: [id number]   [place]: [id number]

## 2019-06-26 ENCOUNTER — Ambulatory Visit: Payer: Medicare Other

## 2019-09-09 ENCOUNTER — Ambulatory Visit (INDEPENDENT_AMBULATORY_CARE_PROVIDER_SITE_OTHER): Payer: Medicare Other | Admitting: Podiatry

## 2019-09-09 ENCOUNTER — Other Ambulatory Visit: Payer: Self-pay

## 2019-09-09 ENCOUNTER — Encounter: Payer: Self-pay | Admitting: Podiatry

## 2019-09-09 ENCOUNTER — Ambulatory Visit (INDEPENDENT_AMBULATORY_CARE_PROVIDER_SITE_OTHER): Payer: Medicare Other

## 2019-09-09 DIAGNOSIS — M778 Other enthesopathies, not elsewhere classified: Secondary | ICD-10-CM

## 2019-09-09 DIAGNOSIS — M2011 Hallux valgus (acquired), right foot: Secondary | ICD-10-CM

## 2019-09-10 NOTE — Progress Notes (Signed)
Subjective:  Patient ID: Alexandra Wilkerson, female    DOB: 07/30/1945,  MRN: [id number] HPI Chief Complaint  Patient presents with  . Foot Pain    Dorsal forefoot right - aching x several months, severe bunion deformity, no injury, doesn't swell, no treatment  . New Patient (Initial Visit)    Est pt 04/2017    74 y.o. female presents with the above complaint.   ROS: Denies fever chills nausea vomiting muscle aches pains calf pain back pain chest pain shortness of breath.  Past Medical History:  Diagnosis Date  . Fecal incontinence   . Internal hemorrhoids    Past Surgical History:  Procedure Laterality Date  . APPENDECTOMY    . HEMORRHOID BANDING    . TONSILLECTOMY      Current Outpatient Medications:  .  cetirizine (ZYRTEC) 10 MG tablet, Take 10 mg by mouth daily., Disp: , Rfl:  .  conjugated estrogens (PREMARIN) vaginal cream, Place 1 Applicatorful vaginally 2 (two) times a week. , Disp: , Rfl:  .  escitalopram (LEXAPRO) 10 MG tablet, Take 10 mg by mouth daily., Disp: , Rfl: 3 .  fluticasone (FLONASE) 50 MCG/ACT nasal spray, Place into both nostrils daily., Disp: , Rfl:  .  losartan (COZAAR) 100 MG tablet, Take 100 mg by mouth daily., Disp: , Rfl:  .  meloxicam (MOBIC) 15 MG tablet, Take 1 tablet (15 mg total) by mouth daily., Disp: 30 tablet, Rfl: 0 .  naproxen (NAPROSYN) 500 MG tablet, Take 500 mg by mouth 2 (two) times daily., Disp: , Rfl:  .  naproxen sodium (ANAPROX) 220 MG tablet, Take 220 mg by mouth as needed. , Disp: , Rfl:  .  valACYclovir (VALTREX) 500 MG tablet, Take 500 mg by mouth as needed., Disp: , Rfl:   Current Facility-Administered Medications:  .  triamcinolone acetonide (KENALOG) 10 MG/ML injection 10 mg, 10 mg, Other, Once, Regal, Tamala Fothergill, DPM  No Known Allergies Review of Systems Objective:  There were no vitals filed for this visit.  General: Well developed, nourished, in no acute distress, alert and oriented x3   Dermatological: Skin is warm,  dry and supple bilateral. Nails x 10 are well maintained; remaining integument appears unremarkable at this time. There are no open sores, no preulcerative lesions, no rash or signs of infection present.  Vascular: Dorsalis Pedis artery and Posterior Tibial artery pedal pulses are 2/4 bilateral with immedate capillary fill time. Pedal hair growth present. No varicosities and no lower extremity edema present bilateral.   Neruologic: Grossly intact via light touch bilateral. Vibratory intact via tuning fork bilateral. Protective threshold with Semmes Wienstein monofilament intact to all pedal sites bilateral. Patellar and Achilles deep tendon reflexes 2+ bilateral. No Babinski or clonus noted bilateral.   Musculoskeletal: No gross boney pedal deformities bilateral. No pain, crepitus, or limitation noted with foot and ankle range of motion bilateral. Muscular strength 5/5 in all groups tested bilateral.  Moderate to severe hallux abductovalgus deformity with hypermobility of the first TMT joint right foot.  The first metatarsophalangeal joint is reducible.  She does have palpable spurs to the dorsal aspect of the foot overlying the tarsometatarsal joints.  Significant discomfort in this area with palpation.  Gait: Unassisted, Nonantalgic.    Radiographs:  Radiographs of the right foot today demonstrate an osseously mature individual with moderate osteopenia.  Significant osteoarthritis of the tarsometatarsal joints.  An increase in the first intermetatarsal angle greater than 15 degrees on the right foot with hallux abductus angle  greater than normal value and dislocating.  No acute abnormalities are identified.  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  Dislocation of the first metatarsophalangeal joint.  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   Plan: We discussed etiology pathology  conservative versus surgical therapies at this point we discussed an injection to the dorsal aspect of the foot which we performed today with 20 mg of Kenalog 5 mg of Marcaine point maximal tenderness.  This was injected across the dorsal aspect of the TMT joints.  She tolerated procedure well without complications.  Follow-up with her in a few weeks.     Lliam Hoh T. [place], [place]

## 2019-10-21 ENCOUNTER — Ambulatory Visit: Payer: Medicare Other | Admitting: Podiatry

## 2019-11-06 ENCOUNTER — Ambulatory Visit: Payer: Medicare Other | Admitting: Podiatry

## 2019-12-10 DIAGNOSIS — M1991 Primary osteoarthritis, unspecified site: Secondary | ICD-10-CM | POA: Diagnosis not present

## 2019-12-10 DIAGNOSIS — J309 Allergic rhinitis, unspecified: Secondary | ICD-10-CM | POA: Diagnosis not present

## 2019-12-10 DIAGNOSIS — N952 Postmenopausal atrophic vaginitis: Secondary | ICD-10-CM | POA: Diagnosis not present

## 2019-12-10 DIAGNOSIS — F325 Major depressive disorder, single episode, in full remission: Secondary | ICD-10-CM | POA: Diagnosis not present

## 2019-12-10 DIAGNOSIS — Z0000 Encounter for general adult medical examination without abnormal findings: Secondary | ICD-10-CM | POA: Diagnosis not present

## 2019-12-10 DIAGNOSIS — Z6825 Body mass index (BMI) 25.0-25.9, adult: Secondary | ICD-10-CM | POA: Diagnosis not present

## 2019-12-10 DIAGNOSIS — I10 Essential (primary) hypertension: Secondary | ICD-10-CM | POA: Diagnosis not present

## 2019-12-10 DIAGNOSIS — M859 Disorder of bone density and structure, unspecified: Secondary | ICD-10-CM | POA: Diagnosis not present

## 2020-01-06 DIAGNOSIS — M1711 Unilateral primary osteoarthritis, right knee: Secondary | ICD-10-CM | POA: Diagnosis not present

## 2020-01-06 DIAGNOSIS — M25561 Pain in right knee: Secondary | ICD-10-CM | POA: Diagnosis not present

## 2020-01-06 DIAGNOSIS — M1712 Unilateral primary osteoarthritis, left knee: Secondary | ICD-10-CM | POA: Diagnosis not present

## 2020-01-06 DIAGNOSIS — M25562 Pain in left knee: Secondary | ICD-10-CM | POA: Diagnosis not present

## 2020-01-20 ENCOUNTER — Other Ambulatory Visit: Payer: Self-pay | Admitting: Family Medicine

## 2020-01-20 DIAGNOSIS — Z1231 Encounter for screening mammogram for malignant neoplasm of breast: Secondary | ICD-10-CM

## 2020-02-12 ENCOUNTER — Ambulatory Visit: Payer: Medicare Other

## 2020-02-14 DIAGNOSIS — Z23 Encounter for immunization: Secondary | ICD-10-CM | POA: Diagnosis not present

## 2020-02-18 ENCOUNTER — Other Ambulatory Visit: Payer: Self-pay

## 2020-02-18 ENCOUNTER — Ambulatory Visit
Admission: RE | Admit: 2020-02-18 | Discharge: 2020-02-18 | Disposition: A | Discharge status: Home / Self Care | Payer: Medicare Other | Source: Ambulatory Visit | Attending: Family Medicine | Admitting: Family Medicine

## 2020-02-18 DIAGNOSIS — Z1231 Encounter for screening mammogram for malignant neoplasm of breast: Secondary | ICD-10-CM

## 2020-03-02 DIAGNOSIS — I10 Essential (primary) hypertension: Secondary | ICD-10-CM | POA: Diagnosis not present

## 2020-03-02 DIAGNOSIS — M1711 Unilateral primary osteoarthritis, right knee: Secondary | ICD-10-CM | POA: Diagnosis not present

## 2020-03-02 DIAGNOSIS — Z01818 Encounter for other preprocedural examination: Secondary | ICD-10-CM | POA: Diagnosis not present

## 2020-03-02 DIAGNOSIS — E7800 Pure hypercholesterolemia, unspecified: Secondary | ICD-10-CM | POA: Diagnosis not present

## 2020-03-02 DIAGNOSIS — Z23 Encounter for immunization: Secondary | ICD-10-CM | POA: Diagnosis not present

## 2020-03-02 DIAGNOSIS — Z6826 Body mass index (BMI) 26.0-26.9, adult: Secondary | ICD-10-CM | POA: Diagnosis not present

## 2020-03-02 DIAGNOSIS — M8580 Other specified disorders of bone density and structure, unspecified site: Secondary | ICD-10-CM | POA: Diagnosis not present

## 2020-03-09 DIAGNOSIS — R059 Cough, unspecified: Secondary | ICD-10-CM | POA: Diagnosis not present

## 2020-03-09 DIAGNOSIS — Z03818 Encounter for observation for suspected exposure to other biological agents ruled out: Secondary | ICD-10-CM | POA: Diagnosis not present

## 2020-03-11 DIAGNOSIS — M25561 Pain in right knee: Secondary | ICD-10-CM | POA: Diagnosis not present

## 2020-03-11 DIAGNOSIS — M170 Bilateral primary osteoarthritis of knee: Secondary | ICD-10-CM | POA: Diagnosis not present

## 2020-03-23 DIAGNOSIS — J018 Other acute sinusitis: Secondary | ICD-10-CM | POA: Diagnosis not present

## 2020-03-23 DIAGNOSIS — J40 Bronchitis, not specified as acute or chronic: Secondary | ICD-10-CM | POA: Diagnosis not present

## 2020-03-30 DIAGNOSIS — Z01812 Encounter for preprocedural laboratory examination: Secondary | ICD-10-CM | POA: Diagnosis not present

## 2020-04-14 DIAGNOSIS — G8918 Other acute postprocedural pain: Secondary | ICD-10-CM | POA: Diagnosis not present

## 2020-04-14 DIAGNOSIS — M1711 Unilateral primary osteoarthritis, right knee: Secondary | ICD-10-CM | POA: Diagnosis not present

## 2020-04-15 DIAGNOSIS — Z7982 Long term (current) use of aspirin: Secondary | ICD-10-CM | POA: Diagnosis not present

## 2020-04-15 DIAGNOSIS — Z79891 Long term (current) use of opiate analgesic: Secondary | ICD-10-CM | POA: Diagnosis not present

## 2020-04-15 DIAGNOSIS — Z791 Long term (current) use of non-steroidal anti-inflammatories (NSAID): Secondary | ICD-10-CM | POA: Diagnosis not present

## 2020-04-15 DIAGNOSIS — Z96651 Presence of right artificial knee joint: Secondary | ICD-10-CM | POA: Diagnosis not present

## 2020-04-15 DIAGNOSIS — Z471 Aftercare following joint replacement surgery: Secondary | ICD-10-CM | POA: Diagnosis not present

## 2020-04-16 DIAGNOSIS — Z96651 Presence of right artificial knee joint: Secondary | ICD-10-CM | POA: Diagnosis not present

## 2020-04-16 DIAGNOSIS — Z7982 Long term (current) use of aspirin: Secondary | ICD-10-CM | POA: Diagnosis not present

## 2020-04-16 DIAGNOSIS — Z79891 Long term (current) use of opiate analgesic: Secondary | ICD-10-CM | POA: Diagnosis not present

## 2020-04-16 DIAGNOSIS — Z791 Long term (current) use of non-steroidal anti-inflammatories (NSAID): Secondary | ICD-10-CM | POA: Diagnosis not present

## 2020-04-16 DIAGNOSIS — Z471 Aftercare following joint replacement surgery: Secondary | ICD-10-CM | POA: Diagnosis not present

## 2020-04-20 DIAGNOSIS — Z96651 Presence of right artificial knee joint: Secondary | ICD-10-CM | POA: Diagnosis not present

## 2020-04-20 DIAGNOSIS — Z791 Long term (current) use of non-steroidal anti-inflammatories (NSAID): Secondary | ICD-10-CM | POA: Diagnosis not present

## 2020-04-20 DIAGNOSIS — Z7982 Long term (current) use of aspirin: Secondary | ICD-10-CM | POA: Diagnosis not present

## 2020-04-20 DIAGNOSIS — Z471 Aftercare following joint replacement surgery: Secondary | ICD-10-CM | POA: Diagnosis not present

## 2020-04-20 DIAGNOSIS — Z79891 Long term (current) use of opiate analgesic: Secondary | ICD-10-CM | POA: Diagnosis not present

## 2020-04-22 DIAGNOSIS — Z471 Aftercare following joint replacement surgery: Secondary | ICD-10-CM | POA: Diagnosis not present

## 2020-04-22 DIAGNOSIS — Z7982 Long term (current) use of aspirin: Secondary | ICD-10-CM | POA: Diagnosis not present

## 2020-04-22 DIAGNOSIS — Z96651 Presence of right artificial knee joint: Secondary | ICD-10-CM | POA: Diagnosis not present

## 2020-04-22 DIAGNOSIS — Z79891 Long term (current) use of opiate analgesic: Secondary | ICD-10-CM | POA: Diagnosis not present

## 2020-04-22 DIAGNOSIS — Z791 Long term (current) use of non-steroidal anti-inflammatories (NSAID): Secondary | ICD-10-CM | POA: Diagnosis not present

## 2020-04-23 DIAGNOSIS — Z79891 Long term (current) use of opiate analgesic: Secondary | ICD-10-CM | POA: Diagnosis not present

## 2020-04-23 DIAGNOSIS — Z471 Aftercare following joint replacement surgery: Secondary | ICD-10-CM | POA: Diagnosis not present

## 2020-04-23 DIAGNOSIS — Z791 Long term (current) use of non-steroidal anti-inflammatories (NSAID): Secondary | ICD-10-CM | POA: Diagnosis not present

## 2020-04-23 DIAGNOSIS — Z96651 Presence of right artificial knee joint: Secondary | ICD-10-CM | POA: Diagnosis not present

## 2020-04-23 DIAGNOSIS — Z7982 Long term (current) use of aspirin: Secondary | ICD-10-CM | POA: Diagnosis not present

## 2020-04-27 DIAGNOSIS — R269 Unspecified abnormalities of gait and mobility: Secondary | ICD-10-CM | POA: Diagnosis not present

## 2020-04-27 DIAGNOSIS — R262 Difficulty in walking, not elsewhere classified: Secondary | ICD-10-CM | POA: Diagnosis not present

## 2020-04-27 DIAGNOSIS — Z471 Aftercare following joint replacement surgery: Secondary | ICD-10-CM | POA: Diagnosis not present

## 2020-04-27 DIAGNOSIS — M6281 Muscle weakness (generalized): Secondary | ICD-10-CM | POA: Diagnosis not present

## 2020-04-27 DIAGNOSIS — Z96651 Presence of right artificial knee joint: Secondary | ICD-10-CM | POA: Diagnosis not present

## 2020-04-30 DIAGNOSIS — R262 Difficulty in walking, not elsewhere classified: Secondary | ICD-10-CM | POA: Diagnosis not present

## 2020-04-30 DIAGNOSIS — Z96651 Presence of right artificial knee joint: Secondary | ICD-10-CM | POA: Diagnosis not present

## 2020-04-30 DIAGNOSIS — R269 Unspecified abnormalities of gait and mobility: Secondary | ICD-10-CM | POA: Diagnosis not present

## 2020-04-30 DIAGNOSIS — M6281 Muscle weakness (generalized): Secondary | ICD-10-CM | POA: Diagnosis not present

## 2020-05-04 DIAGNOSIS — R262 Difficulty in walking, not elsewhere classified: Secondary | ICD-10-CM | POA: Diagnosis not present

## 2020-05-04 DIAGNOSIS — M6281 Muscle weakness (generalized): Secondary | ICD-10-CM | POA: Diagnosis not present

## 2020-05-04 DIAGNOSIS — R269 Unspecified abnormalities of gait and mobility: Secondary | ICD-10-CM | POA: Diagnosis not present

## 2020-05-04 DIAGNOSIS — Z96651 Presence of right artificial knee joint: Secondary | ICD-10-CM | POA: Diagnosis not present

## 2020-05-06 DIAGNOSIS — Z96651 Presence of right artificial knee joint: Secondary | ICD-10-CM | POA: Diagnosis not present

## 2020-05-06 DIAGNOSIS — R262 Difficulty in walking, not elsewhere classified: Secondary | ICD-10-CM | POA: Diagnosis not present

## 2020-05-06 DIAGNOSIS — M6281 Muscle weakness (generalized): Secondary | ICD-10-CM | POA: Diagnosis not present

## 2020-05-06 DIAGNOSIS — R269 Unspecified abnormalities of gait and mobility: Secondary | ICD-10-CM | POA: Diagnosis not present

## 2020-05-11 DIAGNOSIS — M6281 Muscle weakness (generalized): Secondary | ICD-10-CM | POA: Diagnosis not present

## 2020-05-11 DIAGNOSIS — R269 Unspecified abnormalities of gait and mobility: Secondary | ICD-10-CM | POA: Diagnosis not present

## 2020-05-11 DIAGNOSIS — Z96651 Presence of right artificial knee joint: Secondary | ICD-10-CM | POA: Diagnosis not present

## 2020-05-11 DIAGNOSIS — R262 Difficulty in walking, not elsewhere classified: Secondary | ICD-10-CM | POA: Diagnosis not present

## 2020-05-13 DIAGNOSIS — R262 Difficulty in walking, not elsewhere classified: Secondary | ICD-10-CM | POA: Diagnosis not present

## 2020-05-13 DIAGNOSIS — M6281 Muscle weakness (generalized): Secondary | ICD-10-CM | POA: Diagnosis not present

## 2020-05-13 DIAGNOSIS — Z96651 Presence of right artificial knee joint: Secondary | ICD-10-CM | POA: Diagnosis not present

## 2020-05-13 DIAGNOSIS — R269 Unspecified abnormalities of gait and mobility: Secondary | ICD-10-CM | POA: Diagnosis not present

## 2020-05-18 DIAGNOSIS — R262 Difficulty in walking, not elsewhere classified: Secondary | ICD-10-CM | POA: Diagnosis not present

## 2020-05-18 DIAGNOSIS — M6281 Muscle weakness (generalized): Secondary | ICD-10-CM | POA: Diagnosis not present

## 2020-05-18 DIAGNOSIS — Z96651 Presence of right artificial knee joint: Secondary | ICD-10-CM | POA: Diagnosis not present

## 2020-05-18 DIAGNOSIS — R269 Unspecified abnormalities of gait and mobility: Secondary | ICD-10-CM | POA: Diagnosis not present

## 2020-05-26 DIAGNOSIS — R262 Difficulty in walking, not elsewhere classified: Secondary | ICD-10-CM | POA: Diagnosis not present

## 2020-05-26 DIAGNOSIS — Z96651 Presence of right artificial knee joint: Secondary | ICD-10-CM | POA: Diagnosis not present

## 2020-05-26 DIAGNOSIS — M6281 Muscle weakness (generalized): Secondary | ICD-10-CM | POA: Diagnosis not present

## 2020-05-26 DIAGNOSIS — R269 Unspecified abnormalities of gait and mobility: Secondary | ICD-10-CM | POA: Diagnosis not present

## 2020-05-27 DIAGNOSIS — Z96651 Presence of right artificial knee joint: Secondary | ICD-10-CM | POA: Diagnosis not present

## 2020-05-27 DIAGNOSIS — M6281 Muscle weakness (generalized): Secondary | ICD-10-CM | POA: Diagnosis not present

## 2020-05-27 DIAGNOSIS — R262 Difficulty in walking, not elsewhere classified: Secondary | ICD-10-CM | POA: Diagnosis not present

## 2020-05-27 DIAGNOSIS — R269 Unspecified abnormalities of gait and mobility: Secondary | ICD-10-CM | POA: Diagnosis not present

## 2020-06-03 DIAGNOSIS — R262 Difficulty in walking, not elsewhere classified: Secondary | ICD-10-CM | POA: Diagnosis not present

## 2020-06-03 DIAGNOSIS — R269 Unspecified abnormalities of gait and mobility: Secondary | ICD-10-CM | POA: Diagnosis not present

## 2020-06-03 DIAGNOSIS — M6281 Muscle weakness (generalized): Secondary | ICD-10-CM | POA: Diagnosis not present

## 2020-06-03 DIAGNOSIS — Z96651 Presence of right artificial knee joint: Secondary | ICD-10-CM | POA: Diagnosis not present

## 2020-06-08 DIAGNOSIS — R262 Difficulty in walking, not elsewhere classified: Secondary | ICD-10-CM | POA: Diagnosis not present

## 2020-06-08 DIAGNOSIS — Z96651 Presence of right artificial knee joint: Secondary | ICD-10-CM | POA: Diagnosis not present

## 2020-06-08 DIAGNOSIS — M6281 Muscle weakness (generalized): Secondary | ICD-10-CM | POA: Diagnosis not present

## 2020-06-08 DIAGNOSIS — R269 Unspecified abnormalities of gait and mobility: Secondary | ICD-10-CM | POA: Diagnosis not present

## 2020-06-10 DIAGNOSIS — R269 Unspecified abnormalities of gait and mobility: Secondary | ICD-10-CM | POA: Diagnosis not present

## 2020-06-10 DIAGNOSIS — R262 Difficulty in walking, not elsewhere classified: Secondary | ICD-10-CM | POA: Diagnosis not present

## 2020-06-10 DIAGNOSIS — M6281 Muscle weakness (generalized): Secondary | ICD-10-CM | POA: Diagnosis not present

## 2020-06-10 DIAGNOSIS — Z96651 Presence of right artificial knee joint: Secondary | ICD-10-CM | POA: Diagnosis not present

## 2020-06-14 DIAGNOSIS — J343 Hypertrophy of nasal turbinates: Secondary | ICD-10-CM | POA: Diagnosis not present

## 2020-06-14 DIAGNOSIS — H6982 Other specified disorders of Eustachian tube, left ear: Secondary | ICD-10-CM | POA: Diagnosis not present

## 2020-06-14 DIAGNOSIS — H6522 Chronic serous otitis media, left ear: Secondary | ICD-10-CM | POA: Diagnosis not present

## 2020-06-14 DIAGNOSIS — J342 Deviated nasal septum: Secondary | ICD-10-CM | POA: Diagnosis not present

## 2020-06-14 DIAGNOSIS — J310 Chronic rhinitis: Secondary | ICD-10-CM | POA: Diagnosis not present

## 2020-06-14 DIAGNOSIS — H9012 Conductive hearing loss, unilateral, left ear, with unrestricted hearing on the contralateral side: Secondary | ICD-10-CM | POA: Diagnosis not present

## 2020-07-05 DIAGNOSIS — Z471 Aftercare following joint replacement surgery: Secondary | ICD-10-CM | POA: Diagnosis not present

## 2020-07-05 DIAGNOSIS — Z96651 Presence of right artificial knee joint: Secondary | ICD-10-CM | POA: Diagnosis not present

## 2020-09-29 DIAGNOSIS — M8580 Other specified disorders of bone density and structure, unspecified site: Secondary | ICD-10-CM | POA: Diagnosis not present

## 2020-09-29 DIAGNOSIS — M1991 Primary osteoarthritis, unspecified site: Secondary | ICD-10-CM | POA: Diagnosis not present

## 2020-09-29 DIAGNOSIS — E7800 Pure hypercholesterolemia, unspecified: Secondary | ICD-10-CM | POA: Diagnosis not present

## 2020-09-29 DIAGNOSIS — M1711 Unilateral primary osteoarthritis, right knee: Secondary | ICD-10-CM | POA: Diagnosis not present

## 2020-09-29 DIAGNOSIS — I10 Essential (primary) hypertension: Secondary | ICD-10-CM | POA: Diagnosis not present

## 2020-09-29 DIAGNOSIS — F325 Major depressive disorder, single episode, in full remission: Secondary | ICD-10-CM | POA: Diagnosis not present

## 2020-09-30 DIAGNOSIS — Z23 Encounter for immunization: Secondary | ICD-10-CM | POA: Diagnosis not present

## 2020-10-05 DIAGNOSIS — D1801 Hemangioma of skin and subcutaneous tissue: Secondary | ICD-10-CM | POA: Diagnosis not present

## 2020-10-05 DIAGNOSIS — D485 Neoplasm of uncertain behavior of skin: Secondary | ICD-10-CM | POA: Diagnosis not present

## 2020-10-05 DIAGNOSIS — L821 Other seborrheic keratosis: Secondary | ICD-10-CM | POA: Diagnosis not present

## 2020-10-05 DIAGNOSIS — L819 Disorder of pigmentation, unspecified: Secondary | ICD-10-CM | POA: Diagnosis not present

## 2020-10-05 DIAGNOSIS — L814 Other melanin hyperpigmentation: Secondary | ICD-10-CM | POA: Diagnosis not present

## 2020-10-05 DIAGNOSIS — L718 Other rosacea: Secondary | ICD-10-CM | POA: Diagnosis not present

## 2020-10-28 DIAGNOSIS — M1712 Unilateral primary osteoarthritis, left knee: Secondary | ICD-10-CM | POA: Diagnosis not present

## 2020-10-28 DIAGNOSIS — M67911 Unspecified disorder of synovium and tendon, right shoulder: Secondary | ICD-10-CM | POA: Diagnosis not present

## 2020-10-28 DIAGNOSIS — Z96651 Presence of right artificial knee joint: Secondary | ICD-10-CM | POA: Diagnosis not present

## 2020-11-05 DIAGNOSIS — M6281 Muscle weakness (generalized): Secondary | ICD-10-CM | POA: Diagnosis not present

## 2020-11-05 DIAGNOSIS — S46011D Strain of muscle(s) and tendon(s) of the rotator cuff of right shoulder, subsequent encounter: Secondary | ICD-10-CM | POA: Diagnosis not present

## 2020-11-05 DIAGNOSIS — M25611 Stiffness of right shoulder, not elsewhere classified: Secondary | ICD-10-CM | POA: Diagnosis not present

## 2021-01-05 DIAGNOSIS — I10 Essential (primary) hypertension: Secondary | ICD-10-CM | POA: Diagnosis not present

## 2021-01-05 DIAGNOSIS — F325 Major depressive disorder, single episode, in full remission: Secondary | ICD-10-CM | POA: Diagnosis not present

## 2021-01-05 DIAGNOSIS — E7800 Pure hypercholesterolemia, unspecified: Secondary | ICD-10-CM | POA: Diagnosis not present

## 2021-01-05 DIAGNOSIS — M8580 Other specified disorders of bone density and structure, unspecified site: Secondary | ICD-10-CM | POA: Diagnosis not present

## 2021-01-05 DIAGNOSIS — M1711 Unilateral primary osteoarthritis, right knee: Secondary | ICD-10-CM | POA: Diagnosis not present

## 2021-01-12 ENCOUNTER — Other Ambulatory Visit: Payer: Self-pay | Admitting: Obstetrics & Gynecology

## 2021-01-12 ENCOUNTER — Other Ambulatory Visit: Payer: Self-pay | Admitting: Nurse Practitioner

## 2021-01-12 DIAGNOSIS — Z1231 Encounter for screening mammogram for malignant neoplasm of breast: Secondary | ICD-10-CM

## 2021-01-13 DIAGNOSIS — Z0000 Encounter for general adult medical examination without abnormal findings: Secondary | ICD-10-CM | POA: Diagnosis not present

## 2021-01-13 DIAGNOSIS — Z1389 Encounter for screening for other disorder: Secondary | ICD-10-CM | POA: Diagnosis not present

## 2021-01-14 DIAGNOSIS — I10 Essential (primary) hypertension: Secondary | ICD-10-CM | POA: Diagnosis not present

## 2021-01-14 DIAGNOSIS — Z23 Encounter for immunization: Secondary | ICD-10-CM | POA: Diagnosis not present

## 2021-01-14 DIAGNOSIS — M8580 Other specified disorders of bone density and structure, unspecified site: Secondary | ICD-10-CM | POA: Diagnosis not present

## 2021-01-14 DIAGNOSIS — N952 Postmenopausal atrophic vaginitis: Secondary | ICD-10-CM | POA: Diagnosis not present

## 2021-01-14 DIAGNOSIS — E7800 Pure hypercholesterolemia, unspecified: Secondary | ICD-10-CM | POA: Diagnosis not present

## 2021-01-14 DIAGNOSIS — F325 Major depressive disorder, single episode, in full remission: Secondary | ICD-10-CM | POA: Diagnosis not present

## 2021-01-18 ENCOUNTER — Other Ambulatory Visit: Payer: Self-pay | Admitting: Family Medicine

## 2021-01-18 DIAGNOSIS — Z1231 Encounter for screening mammogram for malignant neoplasm of breast: Secondary | ICD-10-CM

## 2021-01-18 DIAGNOSIS — M8580 Other specified disorders of bone density and structure, unspecified site: Secondary | ICD-10-CM

## 2021-02-03 DIAGNOSIS — Z23 Encounter for immunization: Secondary | ICD-10-CM | POA: Diagnosis not present

## 2021-02-10 ENCOUNTER — Other Ambulatory Visit: Payer: Self-pay

## 2021-02-10 ENCOUNTER — Ambulatory Visit
Admission: RE | Admit: 2021-02-10 | Discharge: 2021-02-10 | Disposition: A | Discharge status: Home / Self Care | Payer: Medicare Other | Source: Ambulatory Visit | Attending: Family Medicine | Admitting: Family Medicine

## 2021-02-10 DIAGNOSIS — Z780 Asymptomatic menopausal state: Secondary | ICD-10-CM | POA: Diagnosis not present

## 2021-02-10 DIAGNOSIS — M8589 Other specified disorders of bone density and structure, multiple sites: Secondary | ICD-10-CM | POA: Diagnosis not present

## 2021-02-10 DIAGNOSIS — M8580 Other specified disorders of bone density and structure, unspecified site: Secondary | ICD-10-CM

## 2021-02-22 ENCOUNTER — Ambulatory Visit: Payer: Medicare Other

## 2021-03-23 ENCOUNTER — Other Ambulatory Visit: Payer: Self-pay

## 2021-03-23 ENCOUNTER — Ambulatory Visit
Admission: RE | Admit: 2021-03-23 | Discharge: 2021-03-23 | Disposition: A | Discharge status: Home / Self Care | Payer: Medicare Other | Source: Ambulatory Visit | Attending: Family Medicine | Admitting: Family Medicine

## 2021-03-23 DIAGNOSIS — Z1231 Encounter for screening mammogram for malignant neoplasm of breast: Secondary | ICD-10-CM

## 2021-03-24 ENCOUNTER — Other Ambulatory Visit: Payer: Self-pay | Admitting: Family Medicine

## 2021-03-24 DIAGNOSIS — R928 Other abnormal and inconclusive findings on diagnostic imaging of breast: Secondary | ICD-10-CM

## 2021-04-27 ENCOUNTER — Other Ambulatory Visit: Payer: Medicare Other

## 2021-05-04 ENCOUNTER — Ambulatory Visit
Admission: RE | Admit: 2021-05-04 | Discharge: 2021-05-04 | Disposition: A | Discharge status: Home / Self Care | Payer: Medicare Other | Source: Ambulatory Visit | Attending: Family Medicine | Admitting: Family Medicine

## 2021-05-04 ENCOUNTER — Other Ambulatory Visit: Payer: Self-pay | Admitting: Family Medicine

## 2021-05-04 DIAGNOSIS — R922 Inconclusive mammogram: Secondary | ICD-10-CM | POA: Diagnosis not present

## 2021-05-04 DIAGNOSIS — R928 Other abnormal and inconclusive findings on diagnostic imaging of breast: Secondary | ICD-10-CM

## 2021-07-21 DIAGNOSIS — M25562 Pain in left knee: Secondary | ICD-10-CM | POA: Diagnosis not present

## 2021-07-21 DIAGNOSIS — M1712 Unilateral primary osteoarthritis, left knee: Secondary | ICD-10-CM | POA: Diagnosis not present

## 2021-07-21 DIAGNOSIS — M25552 Pain in left hip: Secondary | ICD-10-CM | POA: Diagnosis not present

## 2021-09-16 DIAGNOSIS — Z23 Encounter for immunization: Secondary | ICD-10-CM | POA: Diagnosis not present

## 2021-11-04 ENCOUNTER — Ambulatory Visit
Admission: RE | Admit: 2021-11-04 | Discharge: 2021-11-04 | Disposition: A | Discharge status: Home / Self Care | Payer: Medicare Other | Source: Ambulatory Visit | Attending: Family Medicine | Admitting: Family Medicine

## 2021-11-04 DIAGNOSIS — R928 Other abnormal and inconclusive findings on diagnostic imaging of breast: Secondary | ICD-10-CM

## 2021-11-04 DIAGNOSIS — R922 Inconclusive mammogram: Secondary | ICD-10-CM | POA: Diagnosis not present

## 2021-11-22 DIAGNOSIS — H8113 Benign paroxysmal vertigo, bilateral: Secondary | ICD-10-CM | POA: Diagnosis not present

## 2021-11-22 DIAGNOSIS — R110 Nausea: Secondary | ICD-10-CM | POA: Diagnosis not present

## 2022-01-17 DIAGNOSIS — Z1389 Encounter for screening for other disorder: Secondary | ICD-10-CM | POA: Diagnosis not present

## 2022-01-17 DIAGNOSIS — E7800 Pure hypercholesterolemia, unspecified: Secondary | ICD-10-CM | POA: Diagnosis not present

## 2022-01-17 DIAGNOSIS — Z6826 Body mass index (BMI) 26.0-26.9, adult: Secondary | ICD-10-CM | POA: Diagnosis not present

## 2022-01-17 DIAGNOSIS — F325 Major depressive disorder, single episode, in full remission: Secondary | ICD-10-CM | POA: Diagnosis not present

## 2022-01-17 DIAGNOSIS — I10 Essential (primary) hypertension: Secondary | ICD-10-CM | POA: Diagnosis not present

## 2022-01-17 DIAGNOSIS — Z0000 Encounter for general adult medical examination without abnormal findings: Secondary | ICD-10-CM | POA: Diagnosis not present

## 2022-01-27 DIAGNOSIS — M1712 Unilateral primary osteoarthritis, left knee: Secondary | ICD-10-CM | POA: Diagnosis not present

## 2022-01-27 DIAGNOSIS — M25562 Pain in left knee: Secondary | ICD-10-CM | POA: Diagnosis not present

## 2022-02-06 DIAGNOSIS — D225 Melanocytic nevi of trunk: Secondary | ICD-10-CM | POA: Diagnosis not present

## 2022-02-06 DIAGNOSIS — L821 Other seborrheic keratosis: Secondary | ICD-10-CM | POA: Diagnosis not present

## 2022-02-06 DIAGNOSIS — L814 Other melanin hyperpigmentation: Secondary | ICD-10-CM | POA: Diagnosis not present

## 2022-02-06 DIAGNOSIS — L570 Actinic keratosis: Secondary | ICD-10-CM | POA: Diagnosis not present

## 2022-02-07 DIAGNOSIS — Z23 Encounter for immunization: Secondary | ICD-10-CM | POA: Diagnosis not present

## 2022-02-28 DIAGNOSIS — M1712 Unilateral primary osteoarthritis, left knee: Secondary | ICD-10-CM | POA: Diagnosis not present

## 2022-02-28 DIAGNOSIS — M25562 Pain in left knee: Secondary | ICD-10-CM | POA: Diagnosis not present

## 2022-03-13 DIAGNOSIS — M25662 Stiffness of left knee, not elsewhere classified: Secondary | ICD-10-CM | POA: Diagnosis not present

## 2022-03-13 DIAGNOSIS — R262 Difficulty in walking, not elsewhere classified: Secondary | ICD-10-CM | POA: Diagnosis not present

## 2022-03-13 DIAGNOSIS — M1732 Unilateral post-traumatic osteoarthritis, left knee: Secondary | ICD-10-CM | POA: Diagnosis not present

## 2022-03-15 DIAGNOSIS — Z96652 Presence of left artificial knee joint: Secondary | ICD-10-CM | POA: Diagnosis not present

## 2022-03-15 DIAGNOSIS — G8918 Other acute postprocedural pain: Secondary | ICD-10-CM | POA: Diagnosis not present

## 2022-03-15 DIAGNOSIS — M1712 Unilateral primary osteoarthritis, left knee: Secondary | ICD-10-CM | POA: Diagnosis not present

## 2022-03-17 DIAGNOSIS — Z96652 Presence of left artificial knee joint: Secondary | ICD-10-CM | POA: Diagnosis not present

## 2022-03-17 DIAGNOSIS — Z471 Aftercare following joint replacement surgery: Secondary | ICD-10-CM | POA: Diagnosis not present

## 2022-03-17 DIAGNOSIS — Z96651 Presence of right artificial knee joint: Secondary | ICD-10-CM | POA: Diagnosis not present

## 2022-03-17 DIAGNOSIS — Z7982 Long term (current) use of aspirin: Secondary | ICD-10-CM | POA: Diagnosis not present

## 2022-03-17 DIAGNOSIS — Z9181 History of falling: Secondary | ICD-10-CM | POA: Diagnosis not present

## 2022-03-19 DIAGNOSIS — Z9181 History of falling: Secondary | ICD-10-CM | POA: Diagnosis not present

## 2022-03-19 DIAGNOSIS — Z96652 Presence of left artificial knee joint: Secondary | ICD-10-CM | POA: Diagnosis not present

## 2022-03-19 DIAGNOSIS — Z471 Aftercare following joint replacement surgery: Secondary | ICD-10-CM | POA: Diagnosis not present

## 2022-03-19 DIAGNOSIS — Z7982 Long term (current) use of aspirin: Secondary | ICD-10-CM | POA: Diagnosis not present

## 2022-03-19 DIAGNOSIS — Z96651 Presence of right artificial knee joint: Secondary | ICD-10-CM | POA: Diagnosis not present

## 2022-03-21 DIAGNOSIS — Z96651 Presence of right artificial knee joint: Secondary | ICD-10-CM | POA: Diagnosis not present

## 2022-03-21 DIAGNOSIS — Z96652 Presence of left artificial knee joint: Secondary | ICD-10-CM | POA: Diagnosis not present

## 2022-03-21 DIAGNOSIS — Z7982 Long term (current) use of aspirin: Secondary | ICD-10-CM | POA: Diagnosis not present

## 2022-03-21 DIAGNOSIS — Z471 Aftercare following joint replacement surgery: Secondary | ICD-10-CM | POA: Diagnosis not present

## 2022-03-21 DIAGNOSIS — Z9181 History of falling: Secondary | ICD-10-CM | POA: Diagnosis not present

## 2022-03-23 DIAGNOSIS — Z7982 Long term (current) use of aspirin: Secondary | ICD-10-CM | POA: Diagnosis not present

## 2022-03-23 DIAGNOSIS — Z9181 History of falling: Secondary | ICD-10-CM | POA: Diagnosis not present

## 2022-03-23 DIAGNOSIS — Z96652 Presence of left artificial knee joint: Secondary | ICD-10-CM | POA: Diagnosis not present

## 2022-03-23 DIAGNOSIS — Z96651 Presence of right artificial knee joint: Secondary | ICD-10-CM | POA: Diagnosis not present

## 2022-03-23 DIAGNOSIS — Z471 Aftercare following joint replacement surgery: Secondary | ICD-10-CM | POA: Diagnosis not present

## 2022-03-25 DIAGNOSIS — Z7982 Long term (current) use of aspirin: Secondary | ICD-10-CM | POA: Diagnosis not present

## 2022-03-25 DIAGNOSIS — Z96651 Presence of right artificial knee joint: Secondary | ICD-10-CM | POA: Diagnosis not present

## 2022-03-25 DIAGNOSIS — Z96652 Presence of left artificial knee joint: Secondary | ICD-10-CM | POA: Diagnosis not present

## 2022-03-25 DIAGNOSIS — Z9181 History of falling: Secondary | ICD-10-CM | POA: Diagnosis not present

## 2022-03-25 DIAGNOSIS — Z471 Aftercare following joint replacement surgery: Secondary | ICD-10-CM | POA: Diagnosis not present

## 2022-03-27 DIAGNOSIS — Z96651 Presence of right artificial knee joint: Secondary | ICD-10-CM | POA: Diagnosis not present

## 2022-03-27 DIAGNOSIS — Z96652 Presence of left artificial knee joint: Secondary | ICD-10-CM | POA: Diagnosis not present

## 2022-03-27 DIAGNOSIS — Z471 Aftercare following joint replacement surgery: Secondary | ICD-10-CM | POA: Diagnosis not present

## 2022-03-27 DIAGNOSIS — Z7982 Long term (current) use of aspirin: Secondary | ICD-10-CM | POA: Diagnosis not present

## 2022-03-27 DIAGNOSIS — Z9181 History of falling: Secondary | ICD-10-CM | POA: Diagnosis not present

## 2022-03-28 DIAGNOSIS — M1712 Unilateral primary osteoarthritis, left knee: Secondary | ICD-10-CM | POA: Diagnosis not present

## 2022-03-28 DIAGNOSIS — Z96652 Presence of left artificial knee joint: Secondary | ICD-10-CM | POA: Diagnosis not present

## 2022-03-28 DIAGNOSIS — M25662 Stiffness of left knee, not elsewhere classified: Secondary | ICD-10-CM | POA: Diagnosis not present

## 2022-03-28 DIAGNOSIS — M6281 Muscle weakness (generalized): Secondary | ICD-10-CM | POA: Diagnosis not present

## 2022-04-03 DIAGNOSIS — M25662 Stiffness of left knee, not elsewhere classified: Secondary | ICD-10-CM | POA: Diagnosis not present

## 2022-04-03 DIAGNOSIS — Z96652 Presence of left artificial knee joint: Secondary | ICD-10-CM | POA: Diagnosis not present

## 2022-04-03 DIAGNOSIS — M6281 Muscle weakness (generalized): Secondary | ICD-10-CM | POA: Diagnosis not present

## 2022-04-10 DIAGNOSIS — Z96652 Presence of left artificial knee joint: Secondary | ICD-10-CM | POA: Diagnosis not present

## 2022-04-10 DIAGNOSIS — M6281 Muscle weakness (generalized): Secondary | ICD-10-CM | POA: Diagnosis not present

## 2022-04-10 DIAGNOSIS — M25662 Stiffness of left knee, not elsewhere classified: Secondary | ICD-10-CM | POA: Diagnosis not present

## 2022-04-12 DIAGNOSIS — M25662 Stiffness of left knee, not elsewhere classified: Secondary | ICD-10-CM | POA: Diagnosis not present

## 2022-04-12 DIAGNOSIS — M6281 Muscle weakness (generalized): Secondary | ICD-10-CM | POA: Diagnosis not present

## 2022-04-12 DIAGNOSIS — Z96652 Presence of left artificial knee joint: Secondary | ICD-10-CM | POA: Diagnosis not present

## 2022-04-17 DIAGNOSIS — Z96652 Presence of left artificial knee joint: Secondary | ICD-10-CM | POA: Diagnosis not present

## 2022-04-17 DIAGNOSIS — M25662 Stiffness of left knee, not elsewhere classified: Secondary | ICD-10-CM | POA: Diagnosis not present

## 2022-04-17 DIAGNOSIS — M6281 Muscle weakness (generalized): Secondary | ICD-10-CM | POA: Diagnosis not present

## 2022-04-18 DIAGNOSIS — Z23 Encounter for immunization: Secondary | ICD-10-CM | POA: Diagnosis not present

## 2022-04-19 DIAGNOSIS — M25662 Stiffness of left knee, not elsewhere classified: Secondary | ICD-10-CM | POA: Diagnosis not present

## 2022-04-19 DIAGNOSIS — M6281 Muscle weakness (generalized): Secondary | ICD-10-CM | POA: Diagnosis not present

## 2022-04-19 DIAGNOSIS — Z96652 Presence of left artificial knee joint: Secondary | ICD-10-CM | POA: Diagnosis not present

## 2022-04-24 DIAGNOSIS — M25662 Stiffness of left knee, not elsewhere classified: Secondary | ICD-10-CM | POA: Diagnosis not present

## 2022-04-24 DIAGNOSIS — M6281 Muscle weakness (generalized): Secondary | ICD-10-CM | POA: Diagnosis not present

## 2022-04-24 DIAGNOSIS — Z96652 Presence of left artificial knee joint: Secondary | ICD-10-CM | POA: Diagnosis not present

## 2022-04-26 DIAGNOSIS — Z96652 Presence of left artificial knee joint: Secondary | ICD-10-CM | POA: Diagnosis not present

## 2022-04-26 DIAGNOSIS — M25662 Stiffness of left knee, not elsewhere classified: Secondary | ICD-10-CM | POA: Diagnosis not present

## 2022-04-26 DIAGNOSIS — M6281 Muscle weakness (generalized): Secondary | ICD-10-CM | POA: Diagnosis not present

## 2022-05-03 DIAGNOSIS — M6281 Muscle weakness (generalized): Secondary | ICD-10-CM | POA: Diagnosis not present

## 2022-05-03 DIAGNOSIS — Z96652 Presence of left artificial knee joint: Secondary | ICD-10-CM | POA: Diagnosis not present

## 2022-05-03 DIAGNOSIS — M25662 Stiffness of left knee, not elsewhere classified: Secondary | ICD-10-CM | POA: Diagnosis not present

## 2022-05-16 DIAGNOSIS — Z96652 Presence of left artificial knee joint: Secondary | ICD-10-CM | POA: Diagnosis not present

## 2022-05-16 DIAGNOSIS — M25662 Stiffness of left knee, not elsewhere classified: Secondary | ICD-10-CM | POA: Diagnosis not present

## 2022-05-16 DIAGNOSIS — M6281 Muscle weakness (generalized): Secondary | ICD-10-CM | POA: Diagnosis not present

## 2022-05-23 DIAGNOSIS — Z96652 Presence of left artificial knee joint: Secondary | ICD-10-CM | POA: Diagnosis not present

## 2022-05-23 DIAGNOSIS — M6281 Muscle weakness (generalized): Secondary | ICD-10-CM | POA: Diagnosis not present

## 2022-05-23 DIAGNOSIS — M25662 Stiffness of left knee, not elsewhere classified: Secondary | ICD-10-CM | POA: Diagnosis not present

## 2022-06-09 DIAGNOSIS — N390 Urinary tract infection, site not specified: Secondary | ICD-10-CM | POA: Diagnosis not present

## 2022-06-13 DIAGNOSIS — H25811 Combined forms of age-related cataract, right eye: Secondary | ICD-10-CM | POA: Diagnosis not present

## 2022-06-13 DIAGNOSIS — H25812 Combined forms of age-related cataract, left eye: Secondary | ICD-10-CM | POA: Diagnosis not present

## 2022-06-16 DIAGNOSIS — N390 Urinary tract infection, site not specified: Secondary | ICD-10-CM | POA: Diagnosis not present

## 2022-06-29 DIAGNOSIS — H25811 Combined forms of age-related cataract, right eye: Secondary | ICD-10-CM | POA: Diagnosis not present

## 2022-06-29 DIAGNOSIS — H269 Unspecified cataract: Secondary | ICD-10-CM | POA: Diagnosis not present

## 2022-07-06 DIAGNOSIS — M25562 Pain in left knee: Secondary | ICD-10-CM | POA: Diagnosis not present

## 2022-07-20 DIAGNOSIS — L820 Inflamed seborrheic keratosis: Secondary | ICD-10-CM | POA: Diagnosis not present

## 2022-07-20 DIAGNOSIS — D485 Neoplasm of uncertain behavior of skin: Secondary | ICD-10-CM | POA: Diagnosis not present

## 2022-08-15 DIAGNOSIS — R399 Unspecified symptoms and signs involving the genitourinary system: Secondary | ICD-10-CM | POA: Diagnosis not present

## 2022-08-15 DIAGNOSIS — Z6826 Body mass index (BMI) 26.0-26.9, adult: Secondary | ICD-10-CM | POA: Diagnosis not present

## 2023-01-19 DIAGNOSIS — Z0000 Encounter for general adult medical examination without abnormal findings: Secondary | ICD-10-CM | POA: Diagnosis not present

## 2023-01-19 DIAGNOSIS — Z1389 Encounter for screening for other disorder: Secondary | ICD-10-CM | POA: Diagnosis not present

## 2023-01-19 DIAGNOSIS — Z23 Encounter for immunization: Secondary | ICD-10-CM | POA: Diagnosis not present

## 2023-01-19 DIAGNOSIS — E663 Overweight: Secondary | ICD-10-CM | POA: Diagnosis not present

## 2023-01-23 ENCOUNTER — Other Ambulatory Visit: Payer: Self-pay | Admitting: Family Medicine

## 2023-01-23 DIAGNOSIS — Z1231 Encounter for screening mammogram for malignant neoplasm of breast: Secondary | ICD-10-CM

## 2023-01-24 DIAGNOSIS — E7800 Pure hypercholesterolemia, unspecified: Secondary | ICD-10-CM | POA: Diagnosis not present

## 2023-01-24 DIAGNOSIS — F325 Major depressive disorder, single episode, in full remission: Secondary | ICD-10-CM | POA: Diagnosis not present

## 2023-01-24 DIAGNOSIS — N952 Postmenopausal atrophic vaginitis: Secondary | ICD-10-CM | POA: Diagnosis not present

## 2023-01-24 DIAGNOSIS — I10 Essential (primary) hypertension: Secondary | ICD-10-CM | POA: Diagnosis not present

## 2023-01-24 DIAGNOSIS — Z6826 Body mass index (BMI) 26.0-26.9, adult: Secondary | ICD-10-CM | POA: Diagnosis not present

## 2023-01-25 DIAGNOSIS — Z23 Encounter for immunization: Secondary | ICD-10-CM | POA: Diagnosis not present

## 2023-01-26 ENCOUNTER — Ambulatory Visit
Admission: RE | Admit: 2023-01-26 | Discharge: 2023-01-26 | Disposition: A | Discharge status: Home / Self Care | Payer: Medicare Other | Source: Ambulatory Visit | Attending: Family Medicine | Admitting: Family Medicine

## 2023-01-26 DIAGNOSIS — Z1231 Encounter for screening mammogram for malignant neoplasm of breast: Secondary | ICD-10-CM | POA: Diagnosis not present

## 2023-02-13 DIAGNOSIS — U071 COVID-19: Secondary | ICD-10-CM | POA: Diagnosis not present

## 2023-03-06 DIAGNOSIS — L820 Inflamed seborrheic keratosis: Secondary | ICD-10-CM | POA: Diagnosis not present

## 2023-03-28 ENCOUNTER — Encounter (INDEPENDENT_AMBULATORY_CARE_PROVIDER_SITE_OTHER): Payer: Self-pay

## 2023-03-28 ENCOUNTER — Ambulatory Visit (INDEPENDENT_AMBULATORY_CARE_PROVIDER_SITE_OTHER): Payer: Medicare Other | Admitting: Otolaryngology

## 2023-03-28 VITALS — Ht 62.0 in | Wt 142.0 lb

## 2023-03-28 DIAGNOSIS — H6982 Other specified disorders of Eustachian tube, left ear: Secondary | ICD-10-CM

## 2023-03-28 DIAGNOSIS — J310 Chronic rhinitis: Secondary | ICD-10-CM | POA: Diagnosis not present

## 2023-03-28 DIAGNOSIS — H9012 Conductive hearing loss, unilateral, left ear, with unrestricted hearing on the contralateral side: Secondary | ICD-10-CM | POA: Diagnosis not present

## 2023-03-28 DIAGNOSIS — H6522 Chronic serous otitis media, left ear: Secondary | ICD-10-CM

## 2023-03-28 DIAGNOSIS — R0981 Nasal congestion: Secondary | ICD-10-CM | POA: Diagnosis not present

## 2023-04-01 DIAGNOSIS — H6522 Chronic serous otitis media, left ear: Secondary | ICD-10-CM | POA: Insufficient documentation

## 2023-04-01 DIAGNOSIS — H9012 Conductive hearing loss, unilateral, left ear, with unrestricted hearing on the contralateral side: Secondary | ICD-10-CM | POA: Insufficient documentation

## 2023-04-01 DIAGNOSIS — H6982 Other specified disorders of Eustachian tube, left ear: Secondary | ICD-10-CM | POA: Insufficient documentation

## 2023-04-01 NOTE — Progress Notes (Signed)
Patient ID: Alexandra Wilkerson, female   DOB: 05/15/1946, 77 y.o.   MRN: [id number]  Follow-up: Left middle ear effusion, left eustachian tube dysfunction, left ear conductive hearing loss  HPI: The patient is a 77 year old female who returns today for her follow-up evaluation.  She was last seen in 2022.  At that time, she was complaining of left ear hearing loss.  She was noted to have a left middle ear effusion, secondary to left eustachian tube dysfunction and chronic rhinitis.  She was treated with Flonase nasal spray and Valsalva exercise.  According to the patient, her symptoms resolved after the medical treatment.  She was doing well until September 2024, when she contracted COVID infection.  Since the COVID infection, she has noted increasing left ear congestion and muffled hearing.  Currently she denies any otalgia, otorrhea, vertigo, facial pain, or fever.  Exam: General: Communicates without difficulty, well nourished, no acute distress. Head: Normocephalic, no evidence injury, no tenderness, facial buttresses intact without stepoff. Face/sinus: No tenderness to palpation and percussion. Facial movement is normal and symmetric. Eyes: PERRL, EOMI. No scleral icterus, conjunctivae clear. Neuro: CN II exam reveals vision grossly intact.  No nystagmus at any point of gaze. Ears: Auricles well formed without lesions.  Ear canals are intact without mass or lesion.  No erythema or edema is appreciated.  The TMs are intact with partial left middle ear fluid. Nose: External evaluation reveals normal support and skin without lesions.  Dorsum is intact.  Anterior rhinoscopy reveals congested mucosa over anterior aspect of inferior turbinates and intact septum.  No purulence noted. Oral:  Oral cavity and oropharynx are intact, symmetric, without erythema or edema.  Mucosa is moist without lesions. Neck: Full range of motion without pain.  There is no significant lymphadenopathy.  No masses palpable.  Thyroid bed  within normal limits to palpation.  Parotid glands and submandibular glands equal bilaterally without mass.  Trachea is midline. Neuro:  CN 2-12 grossly intact.   Assessment: 1.  Recurrent left middle ear effusion and left ear conductive hearing loss. 2.  Chronic rhinitis with nasal mucosal congestion.  Plan: 1.  The physical exam findings are reviewed with the patient. 2.  Flonase nasal spray 2 sprays each nostril daily.  The importance of consistent daily use is discussed. 3.  Valsalva exercise multiple times a day. 4.  The patient will return for reevaluation in 3 weeks.  If she continues to be symptomatic, she may benefit from left myringotomy and tube placement.

## 2023-04-16 DIAGNOSIS — H25812 Combined forms of age-related cataract, left eye: Secondary | ICD-10-CM | POA: Diagnosis not present

## 2023-04-26 DIAGNOSIS — H25812 Combined forms of age-related cataract, left eye: Secondary | ICD-10-CM | POA: Diagnosis not present

## 2023-04-26 DIAGNOSIS — H52222 Regular astigmatism, left eye: Secondary | ICD-10-CM | POA: Diagnosis not present

## 2023-04-26 DIAGNOSIS — I10 Essential (primary) hypertension: Secondary | ICD-10-CM | POA: Diagnosis not present

## 2023-06-19 DIAGNOSIS — H57813 Brow ptosis, bilateral: Secondary | ICD-10-CM | POA: Diagnosis not present

## 2023-06-19 DIAGNOSIS — H53483 Generalized contraction of visual field, bilateral: Secondary | ICD-10-CM | POA: Diagnosis not present

## 2023-06-19 DIAGNOSIS — H0279 Other degenerative disorders of eyelid and periocular area: Secondary | ICD-10-CM | POA: Diagnosis not present

## 2023-06-19 DIAGNOSIS — H02413 Mechanical ptosis of bilateral eyelids: Secondary | ICD-10-CM | POA: Diagnosis not present

## 2023-06-19 DIAGNOSIS — H02422 Myogenic ptosis of left eyelid: Secondary | ICD-10-CM | POA: Diagnosis not present

## 2023-06-19 DIAGNOSIS — Z01818 Encounter for other preprocedural examination: Secondary | ICD-10-CM | POA: Diagnosis not present

## 2023-06-19 DIAGNOSIS — H02411 Mechanical ptosis of right eyelid: Secondary | ICD-10-CM | POA: Diagnosis not present

## 2023-06-19 DIAGNOSIS — H02423 Myogenic ptosis of bilateral eyelids: Secondary | ICD-10-CM | POA: Diagnosis not present

## 2023-06-19 DIAGNOSIS — H02412 Mechanical ptosis of left eyelid: Secondary | ICD-10-CM | POA: Diagnosis not present

## 2023-06-19 DIAGNOSIS — H02421 Myogenic ptosis of right eyelid: Secondary | ICD-10-CM | POA: Diagnosis not present

## 2023-06-19 DIAGNOSIS — H02831 Dermatochalasis of right upper eyelid: Secondary | ICD-10-CM | POA: Diagnosis not present

## 2023-06-19 DIAGNOSIS — H02834 Dermatochalasis of left upper eyelid: Secondary | ICD-10-CM | POA: Diagnosis not present

## 2023-09-11 DIAGNOSIS — H0279 Other degenerative disorders of eyelid and periocular area: Secondary | ICD-10-CM | POA: Diagnosis not present

## 2023-09-11 DIAGNOSIS — H02413 Mechanical ptosis of bilateral eyelids: Secondary | ICD-10-CM | POA: Diagnosis not present

## 2023-09-11 DIAGNOSIS — H02422 Myogenic ptosis of left eyelid: Secondary | ICD-10-CM | POA: Diagnosis not present

## 2023-09-11 DIAGNOSIS — H02831 Dermatochalasis of right upper eyelid: Secondary | ICD-10-CM | POA: Diagnosis not present

## 2023-09-11 DIAGNOSIS — H53483 Generalized contraction of visual field, bilateral: Secondary | ICD-10-CM | POA: Diagnosis not present

## 2023-09-11 DIAGNOSIS — H02421 Myogenic ptosis of right eyelid: Secondary | ICD-10-CM | POA: Diagnosis not present

## 2023-09-11 DIAGNOSIS — H02411 Mechanical ptosis of right eyelid: Secondary | ICD-10-CM | POA: Diagnosis not present

## 2023-09-11 DIAGNOSIS — H57813 Brow ptosis, bilateral: Secondary | ICD-10-CM | POA: Diagnosis not present

## 2023-09-11 DIAGNOSIS — H02834 Dermatochalasis of left upper eyelid: Secondary | ICD-10-CM | POA: Diagnosis not present

## 2023-09-11 DIAGNOSIS — H02412 Mechanical ptosis of left eyelid: Secondary | ICD-10-CM | POA: Diagnosis not present

## 2023-09-11 DIAGNOSIS — H02423 Myogenic ptosis of bilateral eyelids: Secondary | ICD-10-CM | POA: Diagnosis not present

## 2023-10-03 DIAGNOSIS — L814 Other melanin hyperpigmentation: Secondary | ICD-10-CM | POA: Diagnosis not present

## 2023-10-03 DIAGNOSIS — D225 Melanocytic nevi of trunk: Secondary | ICD-10-CM | POA: Diagnosis not present

## 2023-10-03 DIAGNOSIS — L820 Inflamed seborrheic keratosis: Secondary | ICD-10-CM | POA: Diagnosis not present

## 2023-10-03 DIAGNOSIS — D2261 Melanocytic nevi of right upper limb, including shoulder: Secondary | ICD-10-CM | POA: Diagnosis not present

## 2023-10-03 DIAGNOSIS — D2262 Melanocytic nevi of left upper limb, including shoulder: Secondary | ICD-10-CM | POA: Diagnosis not present

## 2023-10-03 DIAGNOSIS — L821 Other seborrheic keratosis: Secondary | ICD-10-CM | POA: Diagnosis not present

## 2023-10-21 DIAGNOSIS — Z23 Encounter for immunization: Secondary | ICD-10-CM | POA: Diagnosis not present

## 2023-12-18 DIAGNOSIS — L820 Inflamed seborrheic keratosis: Secondary | ICD-10-CM | POA: Diagnosis not present

## 2024-01-13 IMAGING — MG MM DIGITAL DIAGNOSTIC UNILAT*R* W/ TOMO W/ CAD
4 series · 4 of 12 positions shown · non-contrast
Comparison: Previous exams including RIGHT breast diagnostic
mammogram and ultrasound dated 05/04/2021.

CLINICAL DATA: Follow-up for probably benign superimposition of
normal fibroglandular tissues within the RIGHT breast.

EXAM:
DIGITAL DIAGNOSTIC UNILATERAL RIGHT MAMMOGRAM WITH TOMOSYNTHESIS AND
CAD
TECHNIQUE: Right digital diagnostic mammography and breast tomosynthesis was
performed. The images were evaluated with computer-aided detection.

[R MLO synth-2D]
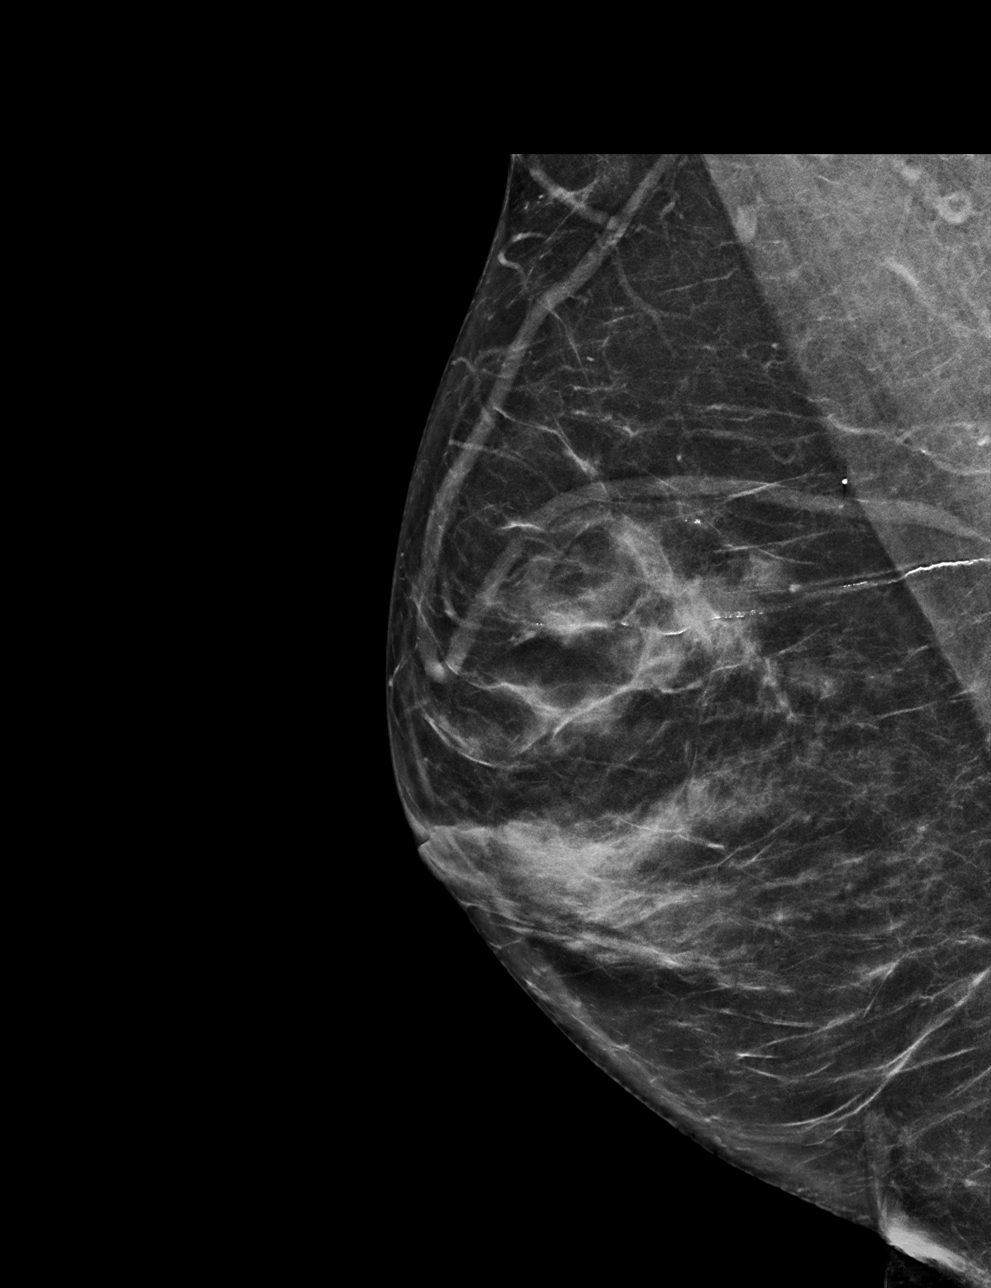

[R CC synth-2D]
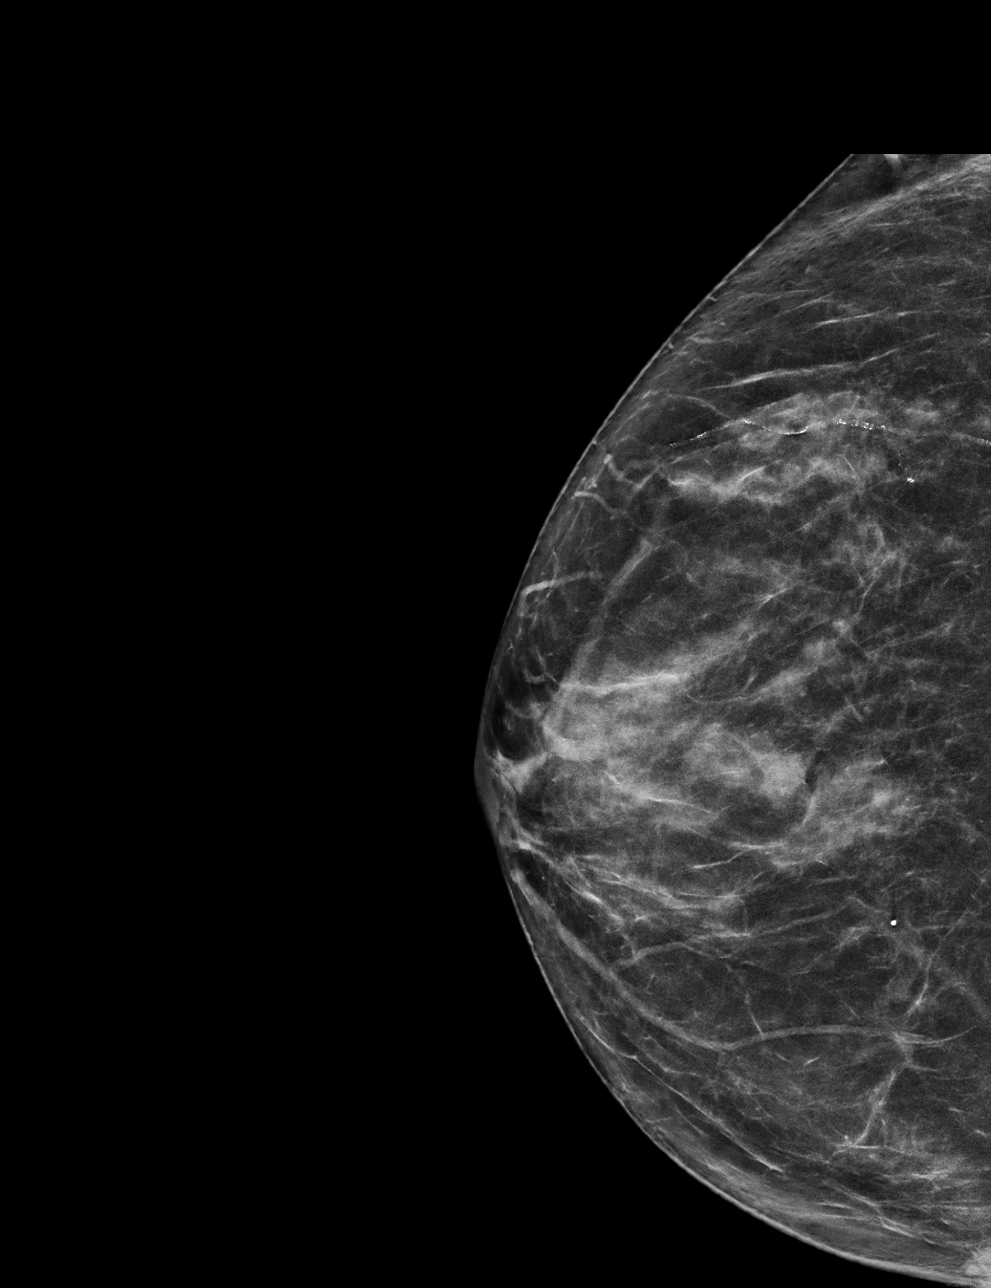

[R CC tomo · tomo slice 33/65.0]
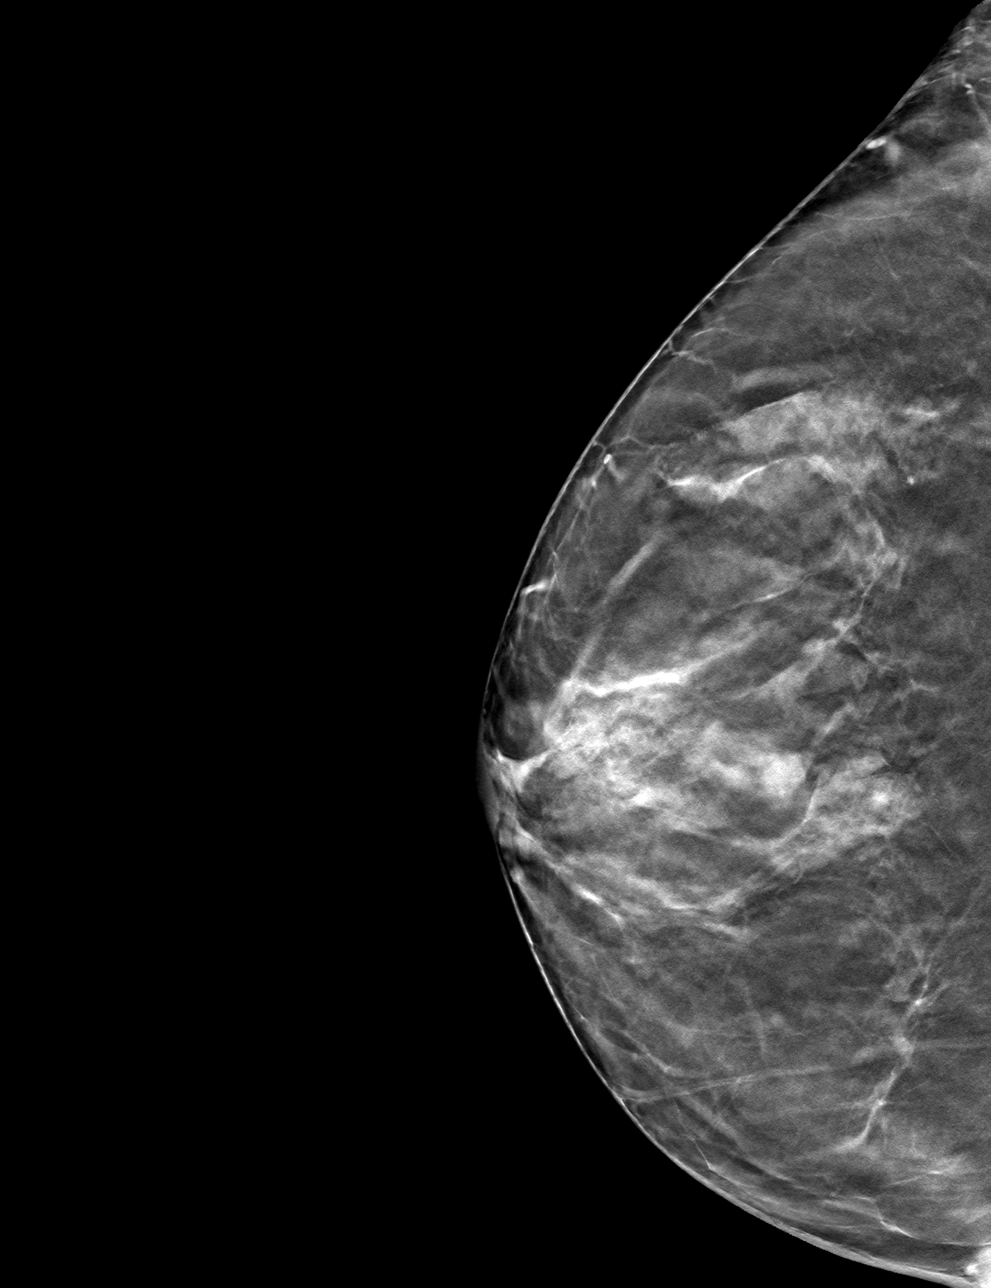

[R MLO tomo · tomo slice 34/67.0]
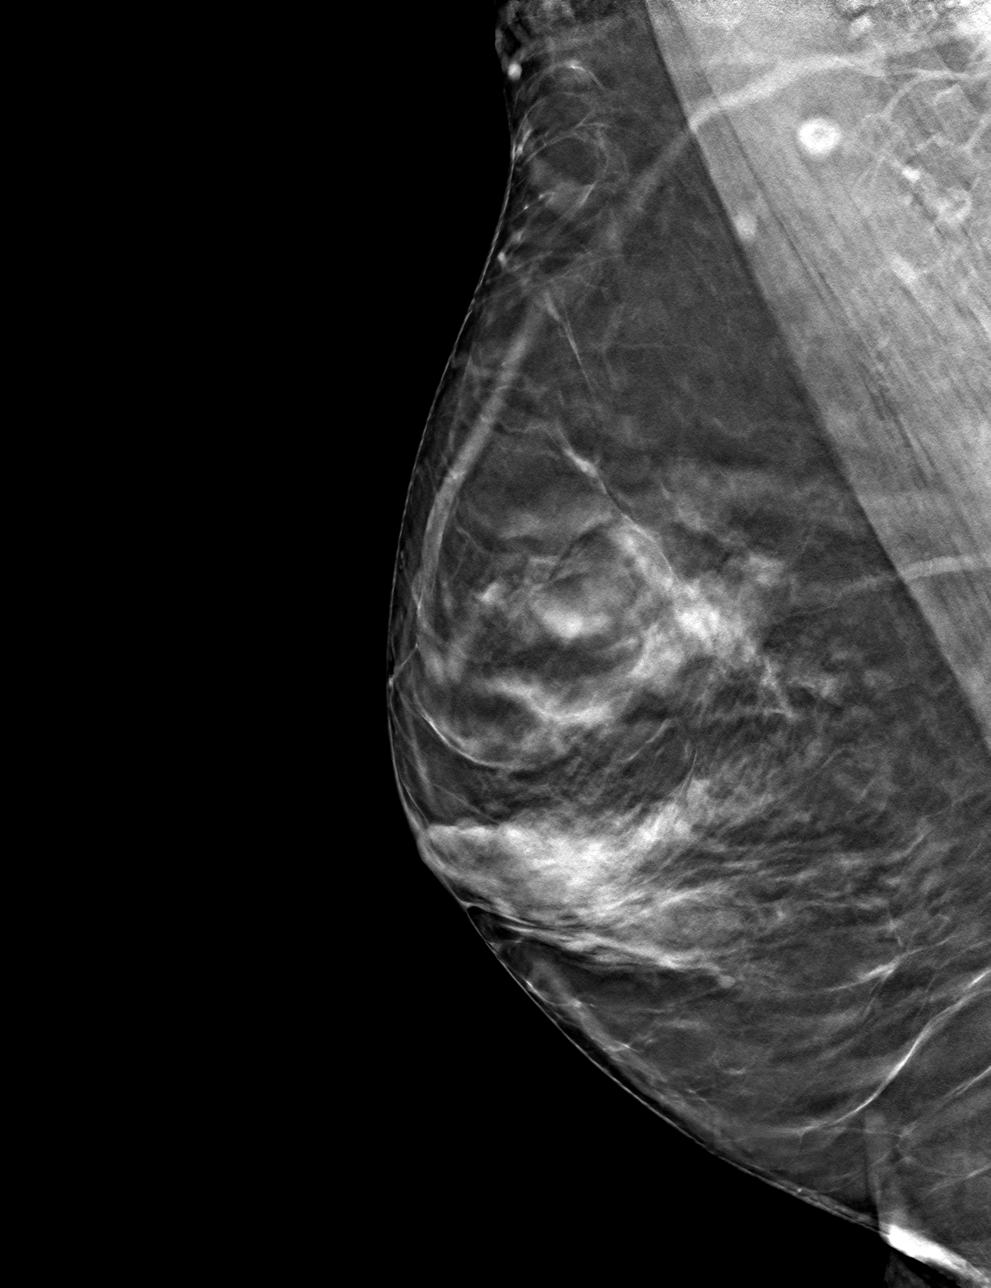

[4 of 12 positions shown; findings below may reference images not displayed]

ACR Breast Density Category c: The breast tissue is heterogeneously
dense, which may obscure small masses.
FINDINGS: On today's diagnostic exam, the fibroglandular pattern of the RIGHT
breast now appears stable for greater than 2 years confirming
benignity. There are no new dominant masses, suspicious
calcifications or secondary signs of malignancy within the RIGHT
breast.
IMPRESSION: No evidence of malignancy.

Patient may return to routine annual bilateral screening mammogram
schedule. Next bilateral screening mammogram will be due in Tuesday March, 2022 corresponding to patient's routine annual screening
mammogram schedule for the LEFT breast.

RECOMMENDATION:
Bilateral screening mammogram in Tuesday March, 2022.

I have discussed the findings and recommendations with the patient.
If applicable, a reminder letter will be sent to the patient
regarding the next appointment.

BI-RADS CATEGORY  1: Negative.

## 2024-01-21 DIAGNOSIS — Z0000 Encounter for general adult medical examination without abnormal findings: Secondary | ICD-10-CM | POA: Diagnosis not present

## 2024-01-21 DIAGNOSIS — Z23 Encounter for immunization: Secondary | ICD-10-CM | POA: Diagnosis not present

## 2024-01-21 DIAGNOSIS — Z1331 Encounter for screening for depression: Secondary | ICD-10-CM | POA: Diagnosis not present

## 2024-01-22 ENCOUNTER — Other Ambulatory Visit: Payer: Self-pay | Admitting: Family Medicine

## 2024-01-22 DIAGNOSIS — Z1231 Encounter for screening mammogram for malignant neoplasm of breast: Secondary | ICD-10-CM

## 2024-01-28 ENCOUNTER — Ambulatory Visit
Admission: RE | Admit: 2024-01-28 | Discharge: 2024-01-28 | Disposition: A | Discharge status: Home / Self Care | Source: Ambulatory Visit | Attending: Family Medicine | Admitting: Family Medicine

## 2024-01-28 DIAGNOSIS — Z1231 Encounter for screening mammogram for malignant neoplasm of breast: Secondary | ICD-10-CM | POA: Diagnosis not present

## 2024-01-30 DIAGNOSIS — E7800 Pure hypercholesterolemia, unspecified: Secondary | ICD-10-CM | POA: Diagnosis not present

## 2024-01-30 DIAGNOSIS — R194 Change in bowel habit: Secondary | ICD-10-CM | POA: Diagnosis not present

## 2024-01-30 DIAGNOSIS — I10 Essential (primary) hypertension: Secondary | ICD-10-CM | POA: Diagnosis not present

## 2024-01-30 DIAGNOSIS — Z6826 Body mass index (BMI) 26.0-26.9, adult: Secondary | ICD-10-CM | POA: Diagnosis not present

## 2024-01-30 DIAGNOSIS — F325 Major depressive disorder, single episode, in full remission: Secondary | ICD-10-CM | POA: Diagnosis not present

## 2024-02-24 DIAGNOSIS — Z23 Encounter for immunization: Secondary | ICD-10-CM | POA: Diagnosis not present

## 2024-02-26 DIAGNOSIS — M7581 Other shoulder lesions, right shoulder: Secondary | ICD-10-CM | POA: Diagnosis not present

## 2024-02-26 DIAGNOSIS — G8929 Other chronic pain: Secondary | ICD-10-CM | POA: Diagnosis not present

## 2024-03-06 DIAGNOSIS — M6281 Muscle weakness (generalized): Secondary | ICD-10-CM | POA: Diagnosis not present

## 2024-03-06 DIAGNOSIS — S46011D Strain of muscle(s) and tendon(s) of the rotator cuff of right shoulder, subsequent encounter: Secondary | ICD-10-CM | POA: Diagnosis not present

## 2024-03-11 DIAGNOSIS — S46011D Strain of muscle(s) and tendon(s) of the rotator cuff of right shoulder, subsequent encounter: Secondary | ICD-10-CM | POA: Diagnosis not present

## 2024-03-11 DIAGNOSIS — M6281 Muscle weakness (generalized): Secondary | ICD-10-CM | POA: Diagnosis not present

## 2024-03-14 DIAGNOSIS — M6281 Muscle weakness (generalized): Secondary | ICD-10-CM | POA: Diagnosis not present

## 2024-03-14 DIAGNOSIS — S46011D Strain of muscle(s) and tendon(s) of the rotator cuff of right shoulder, subsequent encounter: Secondary | ICD-10-CM | POA: Diagnosis not present

## 2024-03-20 DIAGNOSIS — S46011D Strain of muscle(s) and tendon(s) of the rotator cuff of right shoulder, subsequent encounter: Secondary | ICD-10-CM | POA: Diagnosis not present

## 2024-03-20 DIAGNOSIS — M6281 Muscle weakness (generalized): Secondary | ICD-10-CM | POA: Diagnosis not present

## 2024-03-25 DIAGNOSIS — M6281 Muscle weakness (generalized): Secondary | ICD-10-CM | POA: Diagnosis not present

## 2024-03-25 DIAGNOSIS — S46011D Strain of muscle(s) and tendon(s) of the rotator cuff of right shoulder, subsequent encounter: Secondary | ICD-10-CM | POA: Diagnosis not present

## 2024-03-27 DIAGNOSIS — S46011D Strain of muscle(s) and tendon(s) of the rotator cuff of right shoulder, subsequent encounter: Secondary | ICD-10-CM | POA: Diagnosis not present

## 2024-03-27 DIAGNOSIS — M6281 Muscle weakness (generalized): Secondary | ICD-10-CM | POA: Diagnosis not present

## 2024-04-01 DIAGNOSIS — S46011D Strain of muscle(s) and tendon(s) of the rotator cuff of right shoulder, subsequent encounter: Secondary | ICD-10-CM | POA: Diagnosis not present

## 2024-04-01 DIAGNOSIS — M6281 Muscle weakness (generalized): Secondary | ICD-10-CM | POA: Diagnosis not present

## 2024-04-03 DIAGNOSIS — M12811 Other specific arthropathies, not elsewhere classified, right shoulder: Secondary | ICD-10-CM | POA: Diagnosis not present

## 2024-04-03 DIAGNOSIS — M75101 Unspecified rotator cuff tear or rupture of right shoulder, not specified as traumatic: Secondary | ICD-10-CM | POA: Diagnosis not present

## 2024-04-04 DIAGNOSIS — Z23 Encounter for immunization: Secondary | ICD-10-CM | POA: Diagnosis not present

## 2024-04-16 DIAGNOSIS — L820 Inflamed seborrheic keratosis: Secondary | ICD-10-CM | POA: Diagnosis not present

## 2024-04-16 DIAGNOSIS — L821 Other seborrheic keratosis: Secondary | ICD-10-CM | POA: Diagnosis not present
